# Patient Record
Sex: Female | Born: 1970 | Race: White | Hispanic: No | Marital: Single | Smoking: Current some day smoker
Health system: Southern US, Community
[De-identification: ages and names within clinical notes are randomized; demographics above are authoritative.]

## PROBLEM LIST (undated history)

## (undated) DIAGNOSIS — C20 Malignant neoplasm of rectum: Secondary | ICD-10-CM

---

## 2000-02-15 ENCOUNTER — Ambulatory Visit (HOSPITAL_COMMUNITY): Admission: RE | Admit: 2000-02-15 | Discharge: 2000-02-15 | Payer: Self-pay | Admitting: Family Medicine

## 2000-02-15 ENCOUNTER — Encounter: Payer: Self-pay | Admitting: Family Medicine

## 2001-01-12 ENCOUNTER — Other Ambulatory Visit: Admission: RE | Admit: 2001-01-12 | Discharge: 2001-01-12 | Payer: Self-pay | Admitting: Obstetrics and Gynecology

## 2006-03-21 ENCOUNTER — Ambulatory Visit: Payer: Self-pay | Admitting: Family Medicine

## 2007-09-07 ENCOUNTER — Telehealth (INDEPENDENT_AMBULATORY_CARE_PROVIDER_SITE_OTHER): Payer: Self-pay | Admitting: *Deleted

## 2009-06-16 ENCOUNTER — Telehealth (INDEPENDENT_AMBULATORY_CARE_PROVIDER_SITE_OTHER): Payer: Self-pay | Admitting: *Deleted

## 2009-06-16 ENCOUNTER — Ambulatory Visit: Payer: Self-pay | Admitting: Family Medicine

## 2009-06-16 DIAGNOSIS — G43909 Migraine, unspecified, not intractable, without status migrainosus: Secondary | ICD-10-CM | POA: Insufficient documentation

## 2009-06-23 ENCOUNTER — Telehealth: Payer: Self-pay | Admitting: Family Medicine

## 2009-07-10 ENCOUNTER — Encounter: Payer: Self-pay | Admitting: Family Medicine

## 2009-07-10 ENCOUNTER — Ambulatory Visit: Payer: Self-pay | Admitting: Family Medicine

## 2009-07-10 ENCOUNTER — Other Ambulatory Visit: Admission: RE | Admit: 2009-07-10 | Discharge: 2009-07-10 | Payer: Self-pay | Admitting: Family Medicine

## 2009-07-10 DIAGNOSIS — K589 Irritable bowel syndrome without diarrhea: Secondary | ICD-10-CM | POA: Insufficient documentation

## 2009-07-10 DIAGNOSIS — R5383 Other fatigue: Secondary | ICD-10-CM

## 2009-07-10 DIAGNOSIS — N949 Unspecified condition associated with female genital organs and menstrual cycle: Secondary | ICD-10-CM

## 2009-07-10 DIAGNOSIS — R5381 Other malaise: Secondary | ICD-10-CM | POA: Insufficient documentation

## 2009-07-10 DIAGNOSIS — N938 Other specified abnormal uterine and vaginal bleeding: Secondary | ICD-10-CM | POA: Insufficient documentation

## 2009-07-10 LAB — CONVERTED CEMR LAB
ALT: 24 units/L (ref 0–35)
Alkaline Phosphatase: 50 units/L (ref 39–117)
Basophils Relative: 0.3 % (ref 0.0–3.0)
Bilirubin, Direct: 0 mg/dL (ref 0.0–0.3)
Calcium: 9.3 mg/dL (ref 8.4–10.5)
Creatinine, Ser: 0.7 mg/dL (ref 0.4–1.2)
Eosinophils Absolute: 0.2 10*3/uL (ref 0.0–0.7)
Eosinophils Relative: 6.3 % — ABNORMAL HIGH (ref 0.0–5.0)
FSH: 4.8 milliintl units/mL
Free T4: 0.9 ng/dL (ref 0.6–1.6)
GFR calc non Af Amer: 100.22 mL/min (ref >60)
Glucose, Urine, Semiquant: NEGATIVE
Lymphocytes Relative: 31.1 % (ref 12.0–46.0)
Neutrophils Relative %: 48.1 % (ref 43.0–77.0)
RBC: 3.93 M/uL (ref 3.87–5.11)
Sed Rate: 12 mm/hr (ref 0–22)
Specific Gravity, Urine: 1.01
T3, Free: 2.8 pg/mL (ref 2.3–4.2)
Total Protein: 6.7 g/dL (ref 6.0–8.3)
WBC Urine, dipstick: NEGATIVE
WBC: 3.2 10*3/uL — ABNORMAL LOW (ref 4.5–10.5)
pH: 7

## 2009-07-15 ENCOUNTER — Ambulatory Visit: Payer: Self-pay | Admitting: Family Medicine

## 2009-10-29 ENCOUNTER — Telehealth: Payer: Self-pay | Admitting: Family Medicine

## 2009-12-08 ENCOUNTER — Encounter: Payer: Self-pay | Admitting: Gastroenterology

## 2009-12-08 ENCOUNTER — Ambulatory Visit: Payer: Self-pay | Admitting: Hematology and Oncology

## 2009-12-10 ENCOUNTER — Encounter: Payer: Self-pay | Admitting: Gastroenterology

## 2009-12-10 LAB — COMPREHENSIVE METABOLIC PANEL
ALT: 14 U/L (ref 0–35)
AST: 15 U/L (ref 0–37)
Alkaline Phosphatase: 61 U/L (ref 39–117)
CO2: 24 mEq/L (ref 19–32)
Sodium: 139 mEq/L (ref 135–145)
Total Bilirubin: 0.7 mg/dL (ref 0.3–1.2)
Total Protein: 6.8 g/dL (ref 6.0–8.3)

## 2009-12-10 LAB — CBC WITH DIFFERENTIAL/PLATELET
BASO%: 0.7 % (ref 0.0–2.0)
EOS%: 2.6 % (ref 0.0–7.0)
Eosinophils Absolute: 0.2 10*3/uL (ref 0.0–0.5)
MCHC: 34.8 g/dL (ref 31.5–36.0)
MCV: 90.4 fL (ref 79.5–101.0)
MONO%: 7.9 % (ref 0.0–14.0)
NEUT#: 4.7 10*3/uL (ref 1.5–6.5)
RBC: 4.2 10*6/uL (ref 3.70–5.45)
RDW: 12.4 % (ref 11.2–14.5)

## 2009-12-10 LAB — CEA: CEA: 6.2 ng/mL — ABNORMAL HIGH (ref 0.0–5.0)

## 2009-12-11 ENCOUNTER — Encounter: Payer: Self-pay | Admitting: Gastroenterology

## 2009-12-11 ENCOUNTER — Telehealth: Payer: Self-pay | Admitting: Gastroenterology

## 2009-12-12 ENCOUNTER — Ambulatory Visit (HOSPITAL_COMMUNITY): Admission: RE | Admit: 2009-12-12 | Discharge: 2009-12-12 | Payer: Self-pay | Admitting: Hematology and Oncology

## 2009-12-12 ENCOUNTER — Telehealth (INDEPENDENT_AMBULATORY_CARE_PROVIDER_SITE_OTHER): Payer: Self-pay | Admitting: *Deleted

## 2009-12-12 ENCOUNTER — Encounter: Payer: Self-pay | Admitting: Gastroenterology

## 2009-12-12 DIAGNOSIS — C20 Malignant neoplasm of rectum: Secondary | ICD-10-CM | POA: Insufficient documentation

## 2009-12-15 ENCOUNTER — Ambulatory Visit: Payer: Self-pay | Admitting: Gastroenterology

## 2009-12-15 ENCOUNTER — Ambulatory Visit: Admission: RE | Admit: 2009-12-15 | Discharge: 2010-02-10 | Payer: Self-pay | Admitting: Radiation Oncology

## 2009-12-15 ENCOUNTER — Ambulatory Visit (HOSPITAL_COMMUNITY): Admission: RE | Admit: 2009-12-15 | Discharge: 2009-12-15 | Payer: Self-pay | Admitting: Gastroenterology

## 2009-12-17 ENCOUNTER — Encounter: Admission: RE | Admit: 2009-12-17 | Discharge: 2009-12-17 | Payer: Self-pay | Admitting: Hematology and Oncology

## 2009-12-30 ENCOUNTER — Ambulatory Visit (HOSPITAL_COMMUNITY): Admission: RE | Admit: 2009-12-30 | Discharge: 2009-12-30 | Payer: Self-pay | Admitting: Hematology and Oncology

## 2010-01-01 LAB — CBC WITH DIFFERENTIAL/PLATELET
Basophils Absolute: 0.1 10*3/uL (ref 0.0–0.1)
EOS%: 4.6 % (ref 0.0–7.0)
HGB: 13.2 g/dL (ref 11.6–15.9)
LYMPH%: 19.8 % (ref 14.0–49.7)
MCH: 30.8 pg (ref 25.1–34.0)
MCV: 90.4 fL (ref 79.5–101.0)
MONO%: 9.7 % (ref 0.0–14.0)
Platelets: 187 10*3/uL (ref 145–400)
RBC: 4.28 10*6/uL (ref 3.70–5.45)
RDW: 13 % (ref 11.2–14.5)

## 2010-01-01 LAB — BASIC METABOLIC PANEL
BUN: 9 mg/dL (ref 6–23)
CO2: 25 mEq/L (ref 19–32)
Chloride: 105 mEq/L (ref 96–112)
Creatinine, Ser: 0.66 mg/dL (ref 0.40–1.20)
Potassium: 4.2 mEq/L (ref 3.5–5.3)

## 2010-01-08 ENCOUNTER — Ambulatory Visit: Payer: Self-pay | Admitting: Hematology and Oncology

## 2010-01-08 LAB — CBC WITH DIFFERENTIAL/PLATELET
BASO%: 0.3 % (ref 0.0–2.0)
EOS%: 5.6 % (ref 0.0–7.0)
Eosinophils Absolute: 0.2 10*3/uL (ref 0.0–0.5)
LYMPH%: 19 % (ref 14.0–49.7)
MCH: 31.8 pg (ref 25.1–34.0)
MCHC: 35 g/dL (ref 31.5–36.0)
MCV: 91.1 fL (ref 79.5–101.0)
MONO%: 9.5 % (ref 0.0–14.0)
NEUT#: 2.2 10*3/uL (ref 1.5–6.5)
Platelets: 237 10*3/uL (ref 145–400)
RBC: 4.23 10*6/uL (ref 3.70–5.45)
RDW: 12.5 % (ref 11.2–14.5)

## 2010-01-15 LAB — COMPREHENSIVE METABOLIC PANEL
ALT: 14 U/L (ref 0–35)
Albumin: 4.4 g/dL (ref 3.5–5.2)
CO2: 19 mEq/L (ref 19–32)
Potassium: 4.1 mEq/L (ref 3.5–5.3)
Sodium: 138 mEq/L (ref 135–145)
Total Bilirubin: 0.5 mg/dL (ref 0.3–1.2)
Total Protein: 6.5 g/dL (ref 6.0–8.3)

## 2010-01-15 LAB — CBC WITH DIFFERENTIAL/PLATELET
BASO%: 0.3 % (ref 0.0–2.0)
EOS%: 8.3 % — ABNORMAL HIGH (ref 0.0–7.0)
HCT: 38.3 % (ref 34.8–46.6)
LYMPH%: 13.7 % — ABNORMAL LOW (ref 14.0–49.7)
MCH: 31 pg (ref 25.1–34.0)
MCHC: 33.9 g/dL (ref 31.5–36.0)
NEUT%: 65.3 % (ref 38.4–76.8)
Platelets: 125 10*3/uL — ABNORMAL LOW (ref 145–400)
RBC: 4.2 10*6/uL (ref 3.70–5.45)
lymph#: 0.4 10*3/uL — ABNORMAL LOW (ref 0.9–3.3)

## 2010-01-15 LAB — VITAMIN D 25 HYDROXY (VIT D DEFICIENCY, FRACTURES): Vit D, 25-Hydroxy: 26 ng/mL — ABNORMAL LOW (ref 30–89)

## 2010-01-22 LAB — CBC WITH DIFFERENTIAL/PLATELET
BASO%: 0.2 % (ref 0.0–2.0)
EOS%: 13.8 % — ABNORMAL HIGH (ref 0.0–7.0)
MCH: 32.3 pg (ref 25.1–34.0)
MCV: 92.5 fL (ref 79.5–101.0)
MONO%: 12.6 % (ref 0.0–14.0)
RBC: 4.07 10*6/uL (ref 3.70–5.45)
RDW: 12.6 % (ref 11.2–14.5)

## 2010-01-22 LAB — BASIC METABOLIC PANEL
Potassium: 4.1 mEq/L (ref 3.5–5.3)
Sodium: 141 mEq/L (ref 135–145)

## 2010-01-29 LAB — CBC WITH DIFFERENTIAL/PLATELET
BASO%: 0.3 % (ref 0.0–2.0)
EOS%: 11.4 % — ABNORMAL HIGH (ref 0.0–7.0)
HCT: 35.8 % (ref 34.8–46.6)
MCH: 31.7 pg (ref 25.1–34.0)
MCHC: 35.2 g/dL (ref 31.5–36.0)
NEUT%: 68.9 % (ref 38.4–76.8)
lymph#: 0.2 10*3/uL — ABNORMAL LOW (ref 0.9–3.3)

## 2010-01-29 LAB — BASIC METABOLIC PANEL
CO2: 25 mEq/L (ref 19–32)
Glucose, Bld: 95 mg/dL (ref 70–99)
Potassium: 3.9 mEq/L (ref 3.5–5.3)
Sodium: 139 mEq/L (ref 135–145)

## 2010-02-05 ENCOUNTER — Ambulatory Visit: Payer: Self-pay | Admitting: Hematology and Oncology

## 2010-02-05 LAB — CBC WITH DIFFERENTIAL/PLATELET
BASO%: 1.8 % (ref 0.0–2.0)
Basophils Absolute: 0.1 10*3/uL (ref 0.0–0.1)
EOS%: 9.6 % — ABNORMAL HIGH (ref 0.0–7.0)
HGB: 12.7 g/dL (ref 11.6–15.9)
MCH: 32.2 pg (ref 25.1–34.0)
MCHC: 35.2 g/dL (ref 31.5–36.0)
RDW: 16.9 % — ABNORMAL HIGH (ref 11.2–14.5)
lymph#: 0.5 10*3/uL — ABNORMAL LOW (ref 0.9–3.3)
nRBC: 0 % (ref 0–0)

## 2010-02-10 LAB — CBC WITH DIFFERENTIAL/PLATELET
Eosinophils Absolute: 0.3 10*3/uL (ref 0.0–0.5)
MONO#: 0.4 10*3/uL (ref 0.1–0.9)
NEUT#: 2.3 10*3/uL (ref 1.5–6.5)
RBC: 3.73 10*6/uL (ref 3.70–5.45)
RDW: 20 % — ABNORMAL HIGH (ref 11.2–14.5)
WBC: 3.2 10*3/uL — ABNORMAL LOW (ref 3.9–10.3)
lymph#: 0.2 10*3/uL — ABNORMAL LOW (ref 0.9–3.3)

## 2010-02-10 LAB — BASIC METABOLIC PANEL
CO2: 23 mEq/L (ref 19–32)
Glucose, Bld: 121 mg/dL — ABNORMAL HIGH (ref 70–99)
Potassium: 3.8 mEq/L (ref 3.5–5.3)
Sodium: 140 mEq/L (ref 135–145)

## 2010-03-06 ENCOUNTER — Ambulatory Visit (HOSPITAL_COMMUNITY): Admission: RE | Admit: 2010-03-06 | Discharge: 2010-03-06 | Payer: Self-pay | Admitting: Hematology and Oncology

## 2010-03-06 LAB — COMPREHENSIVE METABOLIC PANEL
Alkaline Phosphatase: 44 U/L (ref 39–117)
BUN: 13 mg/dL (ref 6–23)
CO2: 27 mEq/L (ref 19–32)
Creatinine, Ser: 0.75 mg/dL (ref 0.40–1.20)
Glucose, Bld: 83 mg/dL (ref 70–99)
Sodium: 140 mEq/L (ref 135–145)
Total Bilirubin: 0.7 mg/dL (ref 0.3–1.2)
Total Protein: 6.6 g/dL (ref 6.0–8.3)

## 2010-03-06 LAB — CBC WITH DIFFERENTIAL/PLATELET
Basophils Absolute: 0 10*3/uL (ref 0.0–0.1)
Eosinophils Absolute: 0.3 10*3/uL (ref 0.0–0.5)
HCT: 36.5 % (ref 34.8–46.6)
LYMPH%: 9.6 % — ABNORMAL LOW (ref 14.0–49.7)
MCV: 97.3 fL (ref 79.5–101.0)
MONO#: 0.3 10*3/uL (ref 0.1–0.9)
MONO%: 9.4 % (ref 0.0–14.0)
NEUT%: 70.3 % (ref 38.4–76.8)
Platelets: 214 10*3/uL (ref 145–400)

## 2010-03-06 LAB — CEA: CEA: 1.4 ng/mL (ref 0.0–5.0)

## 2010-03-09 ENCOUNTER — Ambulatory Visit: Payer: Self-pay | Admitting: Hematology and Oncology

## 2010-03-15 HISTORY — PX: RECTAL SURGERY: SHX760

## 2010-04-27 ENCOUNTER — Ambulatory Visit: Payer: Self-pay | Admitting: Hematology and Oncology

## 2010-04-29 LAB — COMPREHENSIVE METABOLIC PANEL
ALT: 9 U/L (ref 0–35)
AST: 14 U/L (ref 0–37)
Albumin: 4.1 g/dL (ref 3.5–5.2)
BUN: 13 mg/dL (ref 6–23)
Calcium: 9.3 mg/dL (ref 8.4–10.5)
Chloride: 106 mEq/L (ref 96–112)
Potassium: 4.1 mEq/L (ref 3.5–5.3)

## 2010-04-29 LAB — CBC WITH DIFFERENTIAL/PLATELET
Basophils Absolute: 0 10*3/uL (ref 0.0–0.1)
EOS%: 7.2 % — ABNORMAL HIGH (ref 0.0–7.0)
HGB: 12 g/dL (ref 11.6–15.9)
MCH: 32.9 pg (ref 25.1–34.0)
NEUT#: 2.7 10*3/uL (ref 1.5–6.5)
RDW: 12 % (ref 11.2–14.5)
lymph#: 0.4 10*3/uL — ABNORMAL LOW (ref 0.9–3.3)

## 2010-05-27 ENCOUNTER — Ambulatory Visit: Payer: Self-pay | Admitting: Hematology and Oncology

## 2010-05-27 LAB — CBC WITH DIFFERENTIAL/PLATELET
BASO%: 0.5 % (ref 0.0–2.0)
EOS%: 5.2 % (ref 0.0–7.0)
MCH: 30.8 pg (ref 25.1–34.0)
MCHC: 34.3 g/dL (ref 31.5–36.0)
NEUT%: 73.5 % (ref 38.4–76.8)
RBC: 4.22 10*6/uL (ref 3.70–5.45)
RDW: 12.5 % (ref 11.2–14.5)
lymph#: 0.5 10*3/uL — ABNORMAL LOW (ref 0.9–3.3)

## 2010-07-03 ENCOUNTER — Ambulatory Visit: Payer: Self-pay | Admitting: Hematology and Oncology

## 2010-07-07 LAB — COMPREHENSIVE METABOLIC PANEL
Albumin: 5.1 g/dL (ref 3.5–5.2)
CO2: 24 mEq/L (ref 19–32)
Calcium: 9.6 mg/dL (ref 8.4–10.5)
Glucose, Bld: 87 mg/dL (ref 70–99)
Potassium: 4 mEq/L (ref 3.5–5.3)
Sodium: 140 mEq/L (ref 135–145)
Total Protein: 6.8 g/dL (ref 6.0–8.3)

## 2010-07-07 LAB — CBC WITH DIFFERENTIAL/PLATELET
Basophils Absolute: 0 10*3/uL (ref 0.0–0.1)
Eosinophils Absolute: 0.2 10*3/uL (ref 0.0–0.5)
HGB: 13.3 g/dL (ref 11.6–15.9)
MONO#: 0.4 10*3/uL (ref 0.1–0.9)
NEUT#: 2.3 10*3/uL (ref 1.5–6.5)
Platelets: 147 10*3/uL (ref 145–400)
RBC: 3.99 10*6/uL (ref 3.70–5.45)
RDW: 20.1 % — ABNORMAL HIGH (ref 11.2–14.5)
WBC: 3.2 10*3/uL — ABNORMAL LOW (ref 3.9–10.3)

## 2010-07-07 LAB — LACTATE DEHYDROGENASE: LDH: 182 U/L (ref 94–250)

## 2010-07-09 LAB — VITAMIN B12: Vitamin B-12: 371 pg/mL (ref 211–911)

## 2010-07-28 LAB — CBC WITH DIFFERENTIAL/PLATELET
Basophils Absolute: 0 10*3/uL (ref 0.0–0.1)
Eosinophils Absolute: 0.2 10*3/uL (ref 0.0–0.5)
HCT: 35.6 % (ref 34.8–46.6)
HGB: 12.4 g/dL (ref 11.6–15.9)
LYMPH%: 10 % — ABNORMAL LOW (ref 14.0–49.7)
MCV: 96.2 fL (ref 79.5–101.0)
MONO#: 0.4 10*3/uL (ref 0.1–0.9)
MONO%: 11.8 % (ref 0.0–14.0)
NEUT#: 2.8 10*3/uL (ref 1.5–6.5)
NEUT%: 73.5 % (ref 38.4–76.8)
Platelets: 163 10*3/uL (ref 145–400)
RBC: 3.7 10*6/uL (ref 3.70–5.45)
WBC: 3.8 10*3/uL — ABNORMAL LOW (ref 3.9–10.3)

## 2010-07-28 LAB — COMPREHENSIVE METABOLIC PANEL
BUN: 17 mg/dL (ref 6–23)
CO2: 23 mEq/L (ref 19–32)
Glucose, Bld: 100 mg/dL — ABNORMAL HIGH (ref 70–99)
Sodium: 142 mEq/L (ref 135–145)
Total Bilirubin: 1.4 mg/dL — ABNORMAL HIGH (ref 0.3–1.2)
Total Protein: 6.6 g/dL (ref 6.0–8.3)

## 2010-08-14 ENCOUNTER — Ambulatory Visit: Payer: Self-pay | Admitting: Hematology and Oncology

## 2010-08-18 LAB — COMPREHENSIVE METABOLIC PANEL
ALT: 16 U/L (ref 0–35)
Alkaline Phosphatase: 65 U/L (ref 39–117)
Creatinine, Ser: 0.88 mg/dL (ref 0.40–1.20)
Sodium: 143 mEq/L (ref 135–145)
Total Bilirubin: 1.4 mg/dL — ABNORMAL HIGH (ref 0.3–1.2)
Total Protein: 6.4 g/dL (ref 6.0–8.3)

## 2010-08-18 LAB — CBC WITH DIFFERENTIAL/PLATELET
BASO%: 0.7 % (ref 0.0–2.0)
HCT: 34.9 % (ref 34.8–46.6)
LYMPH%: 19.5 % (ref 14.0–49.7)
MCH: 35 pg — ABNORMAL HIGH (ref 25.1–34.0)
MCHC: 35.6 g/dL (ref 31.5–36.0)
MCV: 98.4 fL (ref 79.5–101.0)
MONO%: 16.2 % — ABNORMAL HIGH (ref 0.0–14.0)
NEUT%: 54.2 % (ref 38.4–76.8)
Platelets: 206 10*3/uL (ref 145–400)
RBC: 3.55 10*6/uL — ABNORMAL LOW (ref 3.70–5.45)

## 2010-09-03 LAB — COMPREHENSIVE METABOLIC PANEL
Alkaline Phosphatase: 66 U/L (ref 39–117)
BUN: 18 mg/dL (ref 6–23)
CO2: 23 mEq/L (ref 19–32)
Creatinine, Ser: 0.69 mg/dL (ref 0.40–1.20)
Glucose, Bld: 88 mg/dL (ref 70–99)
Sodium: 140 mEq/L (ref 135–145)
Total Bilirubin: 2.4 mg/dL — ABNORMAL HIGH (ref 0.3–1.2)

## 2010-09-03 LAB — CBC WITH DIFFERENTIAL/PLATELET
Eosinophils Absolute: 0.2 10*3/uL (ref 0.0–0.5)
HCT: 35.1 % (ref 34.8–46.6)
LYMPH%: 12.2 % — ABNORMAL LOW (ref 14.0–49.7)
MCV: 101 fL (ref 79.5–101.0)
MONO#: 0.3 10*3/uL (ref 0.1–0.9)
MONO%: 10.2 % (ref 0.0–14.0)
NEUT#: 1.7 10*3/uL (ref 1.5–6.5)
NEUT%: 67.9 % (ref 38.4–76.8)
Platelets: 178 10*3/uL (ref 145–400)
RBC: 3.48 10*6/uL — ABNORMAL LOW (ref 3.70–5.45)
WBC: 2.5 10*3/uL — ABNORMAL LOW (ref 3.9–10.3)

## 2010-09-30 ENCOUNTER — Ambulatory Visit: Payer: Self-pay | Admitting: Hematology and Oncology

## 2010-10-02 ENCOUNTER — Ambulatory Visit (HOSPITAL_COMMUNITY): Admission: RE | Admit: 2010-10-02 | Discharge: 2010-10-02 | Payer: Self-pay | Admitting: Hematology and Oncology

## 2010-10-02 LAB — CBC WITH DIFFERENTIAL/PLATELET
BASO%: 0.9 % (ref 0.0–2.0)
Eosinophils Absolute: 0.2 10*3/uL (ref 0.0–0.5)
LYMPH%: 18.1 % (ref 14.0–49.7)
MCHC: 35.8 g/dL (ref 31.5–36.0)
MONO#: 0.4 10*3/uL (ref 0.1–0.9)
NEUT#: 1.6 10*3/uL (ref 1.5–6.5)
RBC: 3.35 10*6/uL — ABNORMAL LOW (ref 3.70–5.45)
RDW: 18.1 % — ABNORMAL HIGH (ref 11.2–14.5)
WBC: 2.7 10*3/uL — ABNORMAL LOW (ref 3.9–10.3)
lymph#: 0.5 10*3/uL — ABNORMAL LOW (ref 0.9–3.3)

## 2010-10-02 LAB — COMPREHENSIVE METABOLIC PANEL
ALT: 15 U/L (ref 0–35)
Albumin: 5.3 g/dL — ABNORMAL HIGH (ref 3.5–5.2)
CO2: 25 mEq/L (ref 19–32)
Glucose, Bld: 58 mg/dL — ABNORMAL LOW (ref 70–99)
Potassium: 3.4 mEq/L — ABNORMAL LOW (ref 3.5–5.3)
Sodium: 136 mEq/L (ref 135–145)
Total Bilirubin: 2.2 mg/dL — ABNORMAL HIGH (ref 0.3–1.2)
Total Protein: 7.3 g/dL (ref 6.0–8.3)

## 2010-10-02 LAB — CEA: CEA: 1.4 ng/mL (ref 0.0–5.0)

## 2010-10-06 ENCOUNTER — Encounter: Payer: Self-pay | Admitting: Family Medicine

## 2010-10-15 HISTORY — PX: OTHER SURGICAL HISTORY: SHX169

## 2010-10-26 ENCOUNTER — Ambulatory Visit: Payer: Self-pay | Admitting: Genetic Counselor

## 2010-11-27 ENCOUNTER — Ambulatory Visit: Payer: Self-pay | Admitting: Hematology and Oncology

## 2010-12-04 ENCOUNTER — Other Ambulatory Visit: Payer: Self-pay | Admitting: Hematology and Oncology

## 2010-12-04 DIAGNOSIS — C20 Malignant neoplasm of rectum: Secondary | ICD-10-CM

## 2010-12-08 ENCOUNTER — Ambulatory Visit: Payer: Self-pay | Admitting: Genetic Counselor

## 2010-12-15 NOTE — Progress Notes (Signed)
Summary: Triage  Phone Note From Other Clinic   Caller: Tanya  Dr. Dalene Carrow  [phone] Call For: Dr. Christella Hartigan Summary of Call: Dr. Dalene Carrow spoke with Dr. Christella Hartigan about scheduling this patient a rectal ultrasound and follow up on this. Initial call taken by: Karna Christmas,  December 11, 2009 1:23 PM  Follow-up for Phone Call        records on Dr Christella Hartigan desk.  Will schedule when I recieve. Follow-up by: Chales Abrahams CMA Duncan Dull),  December 11, 2009 2:26 PM

## 2010-12-15 NOTE — Progress Notes (Signed)
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

## 2010-12-15 NOTE — Procedures (Signed)
Summary: Endoscopic Ultrasound  Patient: Denise Michael Note: All result statuses are Final unless otherwise noted.  Tests: (1) Endoscopic Ultrasound (EUS)  EUS Endoscopic Ultrasound                             DONE     [facility]     [address] [place], [place]  [address]           ENDOSCOPIC ULTRASOUND PROCEDURE REPORT           PATIENT:  [name], [name]  MR#:  [id number]     BIRTHDATE:  June 24, 1971  GENDER:  female     NDOSCOPIST:  Rachael Fee, MD     REFERRED BY:  Arlan Organ, M.D.     PROCEDURE DATE  12/15/2009     PROCEDURE:  Lower EUS     ASA CLASS:  Class II     INDICATIONS:  newly diagnosed rectal adenocarcinoma (Incomplete     colonoscopy by Dr. Kinnie Scales recently).  PET/CT showed no sign of     metastatic disease.     MEDICATIONS:   Fentanyl 75 mcg IV, Versed 7.5 mg IV           DESCRIPTION OF PROCEDURE:   After the risks, benefits, and     alternatives of the procedure were thoroughly explained, informed     consent was obtained.  The  endoscope was introduced through the     and advanced to the .     <<PROCEDUREIMAGES>>           Sigmoidoscopic findings:     1. Nearly circumferential, bulkly, fungating mass in rectum. The     mass occupies 90%of the rectal lumen circumference, the distal     edge of the mass is 8cm from anal verge.           EUS findings:     1. The mass above corresponds with an irregularly bordered,     heterogeneous, predominantly hypoechoic mass that clear passes     into and through the muscularis propria layer of rectal wall     (uT3).  The mass measures 2.2cm in thickness and 7cm in length.     2. There are two 4-75mm, round, well demarcated, hypoechoic     perirectal lymphnodes that are suspicious for malignant     involvement (uN1).           Impression:     7cm long, nearly circumferntial, partially obstructing, uT3N1     rectal adenocarcinoma with distal edge 8cm from anal verge.  I     have  spoken with office of Dr. Dalene Carrow, she will likely benefit     from neoadjuvant chemoxrt.     I will leave future, surveillance colonoscopies to Dr. Kinnie Scales who     diagnosed this cancer last week.           ______________________________     Rachael Fee, MD           cc: Ritta Slot, MD           n.     Rosalie Doctor:   Rachael Fee at 12/15/2009 12:41 PM           Ostlund, Victorino Dike, [phone]  Note: An exclamation mark (!) indicates a result that was not dispersed into the flowsheet. Document Creation Date: 12/15/2009 12:42 PM _______________________________________________________________________  (1)  Order result status: Final Collection or observation date-time: 12/15/2009 12:30 Requested date-time:  Receipt date-time:  Reported date-time:  Referring Physician:   Ordering Physician: Rob Bunting [phone]) Specimen Source:  Source: Launa Grill Order Number: [id number] Lab site:

## 2010-12-15 NOTE — Letter (Signed)
Summary: [facility]  [address] [place], [place] [address]   Phone: [phone]  Fax: [phone]       Denise Michael    [birth year]    MRN: [id number]        Procedure Day /Date:12/15/09     Arrival Time:11 am     Procedure Time:noon     Location of Procedure:                     X  [facility] ( Outpatient Registration)     PREPARATION FOR FLEXIBLE SIGMOIDOSCOPY WITH MAGNESIUM CITRATE  Prior to the day before your procedure, purchase one 8 oz. bottle of Magnesium Citrate and one Fleet Enema from the laxative section of your drugstore.  _________________________________________________________________________________________________  THE DAY BEFORE YOUR PROCEDURE             DATE: 12/14/09    DAY: SUN  1.   Have a clear liquid dinner the night before your procedure.  2.   Do not drink anything colored red or purple.  Avoid juices with pulp.  No orange juice.              CLEAR LIQUIDS INCLUDE: Water Jello Ice Popsicles Tea (sugar ok, no milk/cream) Powdered fruit flavored drinks Coffee (sugar ok, no milk/cream) Gatorade Juice: apple, white grape, white cranberry  Lemonade Clear bullion, consomm, broth Carbonated beverages (any kind) Strained chicken noodle soup Hard Candy   3.   At 7:00 pm the night before your procedure, drink one bottle of Magnesium Citrate over ice.  4.   Drink at least 3 more glasses of clear liquids before bedtime (preferably juices).  5.   Results are expected usually within 1 to 6 hours after taking the Magnesium Citrate.  ___________________________________________________________________________________________________  THE DAY OF YOUR PROCEDURE            DATE: 12/15/09     DAY: MON  1.   Use Fleet Enema one hour prior to coming for procedure.  2.   You may drink clear liquids until 8 am (2 hours before exam)       MEDICATION INSTRUCTIONS  Unless otherwise instructed,  you should take regular prescription medications with a small sip of water as early as possible the morning of your procedure.         OTHER INSTRUCTIONS  You will need a responsible adult at least 39 years of age to accompany you and drive you home.   This person must remain in the waiting room during your procedure.  Wear loose fitting clothing that is easily removed.  Leave jewelry and other valuables at home.  However, you may wish to bring a book to read or an iPod/MP3 player to listen to music as you wait for your procedure to start.  Remove all body piercing jewelry and leave at home.  Total time from sign-in until discharge is approximately 2-3 hours.  You should go home directly after your procedure and rest.  You can resume normal activities the day after your procedure.  The day of your procedure you should not:   Drive   Make legal decisions   Operate machinery   Drink alcohol   Return to work  You will receive specific instructions about eating, activities and medications before you leave.   The above instructions have been reviewed and explained to me by   Chales Abrahams CMA Duncan Dull)  December 12, 2009 4:44  PM      I fully understand and can verbalize these instructions over the phone Date 12/12/09

## 2010-12-15 NOTE — Letter (Signed)
Summary: [facility]  Medication list/[facility]   Imported By: Sherian Rein 12/18/2009 09:17:53  _____________________________________________________________________  External Attachment:    Type:   Image     Comment:   External Document

## 2010-12-15 NOTE — Procedures (Signed)
Summary: Flexible Sigmoidoscopy/[facility]  Flexible Sigmoidoscopy/[facility]   Imported By: Sherian Rein 12/18/2009 09:13:26  _____________________________________________________________________  External Attachment:    Type:   Image     Comment:   External Document

## 2010-12-15 NOTE — Letter (Signed)
Summary: [facility]  [facility]   Imported By: Maryln Gottron 10/14/2010 10:04:48  _____________________________________________________________________  External Attachment:    Type:   Image     Comment:   External Document  Appended Document: [facility].......... please call Karn and have her come in sometime this winter for a general medical exam  Appended Document: [facility] patient is aware and will call back for an appointment

## 2010-12-25 ENCOUNTER — Other Ambulatory Visit: Payer: Self-pay | Admitting: Hematology and Oncology

## 2010-12-25 DIAGNOSIS — Z1231 Encounter for screening mammogram for malignant neoplasm of breast: Secondary | ICD-10-CM

## 2011-01-05 ENCOUNTER — Other Ambulatory Visit (HOSPITAL_COMMUNITY): Payer: Self-pay

## 2011-01-05 ENCOUNTER — Ambulatory Visit (HOSPITAL_COMMUNITY)
Admission: RE | Admit: 2011-01-05 | Discharge: 2011-01-05 | Disposition: A | Discharge status: Home / Self Care | Payer: BC Managed Care – PPO | Source: Ambulatory Visit | Attending: Hematology and Oncology | Admitting: Hematology and Oncology

## 2011-01-05 ENCOUNTER — Other Ambulatory Visit: Payer: Self-pay | Admitting: Hematology and Oncology

## 2011-01-05 DIAGNOSIS — K7689 Other specified diseases of liver: Secondary | ICD-10-CM | POA: Insufficient documentation

## 2011-01-05 DIAGNOSIS — C19 Malignant neoplasm of rectosigmoid junction: Secondary | ICD-10-CM | POA: Insufficient documentation

## 2011-01-05 DIAGNOSIS — C20 Malignant neoplasm of rectum: Secondary | ICD-10-CM

## 2011-01-05 DIAGNOSIS — Z09 Encounter for follow-up examination after completed treatment for conditions other than malignant neoplasm: Secondary | ICD-10-CM | POA: Insufficient documentation

## 2011-01-05 MED ORDER — IOHEXOL 300 MG/ML  SOLN
100.0000 mL | Freq: Once | INTRAMUSCULAR | Status: AC | PRN
  Administered 2011-01-05: 100 mL via INTRAVENOUS

## 2011-01-08 ENCOUNTER — Ambulatory Visit
Admission: RE | Admit: 2011-01-08 | Discharge: 2011-01-08 | Disposition: A | Discharge status: Home / Self Care | Payer: BC Managed Care – PPO | Source: Ambulatory Visit | Attending: Hematology and Oncology | Admitting: Hematology and Oncology

## 2011-01-08 DIAGNOSIS — Z1231 Encounter for screening mammogram for malignant neoplasm of breast: Secondary | ICD-10-CM

## 2011-01-12 ENCOUNTER — Other Ambulatory Visit: Payer: Self-pay | Admitting: Hematology and Oncology

## 2011-01-12 ENCOUNTER — Encounter (HOSPITAL_BASED_OUTPATIENT_CLINIC_OR_DEPARTMENT_OTHER): Payer: BC Managed Care – PPO | Admitting: Hematology and Oncology

## 2011-01-12 DIAGNOSIS — Z452 Encounter for adjustment and management of vascular access device: Secondary | ICD-10-CM

## 2011-01-12 DIAGNOSIS — R928 Other abnormal and inconclusive findings on diagnostic imaging of breast: Secondary | ICD-10-CM

## 2011-01-12 DIAGNOSIS — C20 Malignant neoplasm of rectum: Secondary | ICD-10-CM

## 2011-01-12 DIAGNOSIS — N6320 Unspecified lump in the left breast, unspecified quadrant: Secondary | ICD-10-CM

## 2011-01-12 LAB — COMPREHENSIVE METABOLIC PANEL
Albumin: 4.4 g/dL (ref 3.5–5.2)
Alkaline Phosphatase: 56 U/L (ref 39–117)
Calcium: 9.6 mg/dL (ref 8.4–10.5)
Chloride: 104 mEq/L (ref 96–112)
Glucose, Bld: 109 mg/dL — ABNORMAL HIGH (ref 70–99)
Potassium: 4.2 mEq/L (ref 3.5–5.3)
Sodium: 138 mEq/L (ref 135–145)
Total Protein: 6.4 g/dL (ref 6.0–8.3)

## 2011-01-12 LAB — CBC WITH DIFFERENTIAL/PLATELET
Eosinophils Absolute: 0.2 10*3/uL (ref 0.0–0.5)
HGB: 13 g/dL (ref 11.6–15.9)
MCV: 91.4 fL (ref 79.5–101.0)
MONO#: 0.4 10*3/uL (ref 0.1–0.9)
MONO%: 8.2 % (ref 0.0–14.0)
NEUT#: 3.8 10*3/uL (ref 1.5–6.5)
RBC: 4.12 10*6/uL (ref 3.70–5.45)
RDW: 12 % (ref 11.2–14.5)
WBC: 4.7 10*3/uL (ref 3.9–10.3)
lymph#: 0.3 10*3/uL — ABNORMAL LOW (ref 0.9–3.3)

## 2011-01-14 ENCOUNTER — Ambulatory Visit
Admission: RE | Admit: 2011-01-14 | Discharge: 2011-01-14 | Disposition: A | Discharge status: Home / Self Care | Payer: BC Managed Care – PPO | Source: Ambulatory Visit | Attending: Hematology and Oncology | Admitting: Hematology and Oncology

## 2011-01-14 DIAGNOSIS — R928 Other abnormal and inconclusive findings on diagnostic imaging of breast: Secondary | ICD-10-CM

## 2011-01-19 ENCOUNTER — Other Ambulatory Visit: Payer: BC Managed Care – PPO

## 2011-01-19 ENCOUNTER — Ambulatory Visit (INDEPENDENT_AMBULATORY_CARE_PROVIDER_SITE_OTHER): Payer: BC Managed Care – PPO | Admitting: Gynecology

## 2011-01-19 ENCOUNTER — Other Ambulatory Visit (HOSPITAL_COMMUNITY)
Admission: RE | Admit: 2011-01-19 | Discharge: 2011-01-19 | Disposition: A | Discharge status: Home / Self Care | Payer: BC Managed Care – PPO | Source: Ambulatory Visit | Attending: Gynecology | Admitting: Gynecology

## 2011-01-19 ENCOUNTER — Other Ambulatory Visit: Payer: Self-pay | Admitting: Gynecology

## 2011-01-19 DIAGNOSIS — N912 Amenorrhea, unspecified: Secondary | ICD-10-CM

## 2011-01-19 DIAGNOSIS — Z01419 Encounter for gynecological examination (general) (routine) without abnormal findings: Secondary | ICD-10-CM

## 2011-01-19 DIAGNOSIS — N83339 Acquired atrophy of ovary and fallopian tube, unspecified side: Secondary | ICD-10-CM

## 2011-01-19 DIAGNOSIS — Z124 Encounter for screening for malignant neoplasm of cervix: Secondary | ICD-10-CM | POA: Insufficient documentation

## 2011-01-20 ENCOUNTER — Other Ambulatory Visit (HOSPITAL_COMMUNITY): Payer: BC Managed Care – PPO

## 2011-01-26 ENCOUNTER — Institutional Professional Consult (permissible substitution) (INDEPENDENT_AMBULATORY_CARE_PROVIDER_SITE_OTHER): Payer: BC Managed Care – PPO | Admitting: Gynecology

## 2011-01-26 DIAGNOSIS — N951 Menopausal and female climacteric states: Secondary | ICD-10-CM

## 2011-01-26 DIAGNOSIS — N912 Amenorrhea, unspecified: Secondary | ICD-10-CM

## 2011-02-01 LAB — GLUCOSE, CAPILLARY: Glucose-Capillary: 98 mg/dL (ref 70–99)

## 2011-02-04 LAB — CBC
HCT: 38.3 % (ref 36.0–46.0)
MCV: 90.9 fL (ref 78.0–100.0)
Platelets: 196 10*3/uL (ref 150–400)
RDW: 13 % (ref 11.5–15.5)

## 2011-02-23 ENCOUNTER — Encounter: Payer: BC Managed Care – PPO | Admitting: Hematology and Oncology

## 2011-04-19 ENCOUNTER — Encounter (HOSPITAL_BASED_OUTPATIENT_CLINIC_OR_DEPARTMENT_OTHER): Payer: 59 | Admitting: Hematology and Oncology

## 2011-04-19 DIAGNOSIS — Z452 Encounter for adjustment and management of vascular access device: Secondary | ICD-10-CM

## 2011-04-19 DIAGNOSIS — C20 Malignant neoplasm of rectum: Secondary | ICD-10-CM

## 2011-05-05 ENCOUNTER — Other Ambulatory Visit: Payer: Self-pay | Admitting: Hematology and Oncology

## 2011-05-05 ENCOUNTER — Encounter (HOSPITAL_COMMUNITY): Payer: Self-pay

## 2011-05-05 ENCOUNTER — Ambulatory Visit (HOSPITAL_COMMUNITY)
Admission: RE | Admit: 2011-05-05 | Discharge: 2011-05-05 | Disposition: A | Discharge status: Home / Self Care | Payer: 59 | Source: Ambulatory Visit | Attending: Hematology and Oncology | Admitting: Hematology and Oncology

## 2011-05-05 ENCOUNTER — Encounter (HOSPITAL_BASED_OUTPATIENT_CLINIC_OR_DEPARTMENT_OTHER): Payer: 59 | Admitting: Hematology and Oncology

## 2011-05-05 DIAGNOSIS — Z5111 Encounter for antineoplastic chemotherapy: Secondary | ICD-10-CM

## 2011-05-05 DIAGNOSIS — K7689 Other specified diseases of liver: Secondary | ICD-10-CM | POA: Insufficient documentation

## 2011-05-05 DIAGNOSIS — C20 Malignant neoplasm of rectum: Secondary | ICD-10-CM | POA: Insufficient documentation

## 2011-05-05 HISTORY — DX: Malignant neoplasm of rectum: C20

## 2011-05-05 LAB — CBC WITH DIFFERENTIAL/PLATELET
BASO%: 0.8 % (ref 0.0–2.0)
EOS%: 2.9 % (ref 0.0–7.0)
MCH: 32.2 pg (ref 25.1–34.0)
MCHC: 35 g/dL (ref 31.5–36.0)
RDW: 12 % (ref 11.2–14.5)
lymph#: 0.6 10*3/uL — ABNORMAL LOW (ref 0.9–3.3)

## 2011-05-05 LAB — CEA: CEA: 0.8 ng/mL (ref 0.0–5.0)

## 2011-05-05 MED ORDER — IOHEXOL 300 MG/ML  SOLN
100.0000 mL | Freq: Once | INTRAMUSCULAR | Status: AC | PRN
  Administered 2011-05-05: 100 mL via INTRAVENOUS

## 2011-05-12 ENCOUNTER — Encounter (HOSPITAL_BASED_OUTPATIENT_CLINIC_OR_DEPARTMENT_OTHER): Payer: 59 | Admitting: Hematology and Oncology

## 2011-05-12 DIAGNOSIS — C20 Malignant neoplasm of rectum: Secondary | ICD-10-CM

## 2011-05-26 ENCOUNTER — Ambulatory Visit (INDEPENDENT_AMBULATORY_CARE_PROVIDER_SITE_OTHER): Payer: 59 | Admitting: Gynecology

## 2011-05-26 DIAGNOSIS — N951 Menopausal and female climacteric states: Secondary | ICD-10-CM

## 2011-05-26 DIAGNOSIS — F520 Hypoactive sexual desire disorder: Secondary | ICD-10-CM

## 2011-06-14 ENCOUNTER — Encounter (HOSPITAL_BASED_OUTPATIENT_CLINIC_OR_DEPARTMENT_OTHER): Payer: 59 | Admitting: Hematology and Oncology

## 2011-06-14 DIAGNOSIS — Z452 Encounter for adjustment and management of vascular access device: Secondary | ICD-10-CM

## 2011-06-14 DIAGNOSIS — C20 Malignant neoplasm of rectum: Secondary | ICD-10-CM

## 2011-06-25 ENCOUNTER — Telehealth: Payer: Self-pay | Admitting: *Deleted

## 2011-07-02 ENCOUNTER — Encounter: Payer: 59 | Admitting: Hematology and Oncology

## 2011-07-05 NOTE — Telephone Encounter (Signed)
PT INFORMED THAT SHE NEEDS TO MAKE APPOINTMENT TO SEE DR. Glenetta Hew. PT STATES SHE WILL WAIT UNTIL HER LABS COME BACK FROM DR ODGWU OFFICE, THEN SHE WILL CALL TO MAKE OFFICE VISIT.

## 2011-08-23 ENCOUNTER — Ambulatory Visit (INDEPENDENT_AMBULATORY_CARE_PROVIDER_SITE_OTHER): Payer: 59 | Admitting: Family Medicine

## 2011-08-23 ENCOUNTER — Encounter: Payer: Self-pay | Admitting: Family Medicine

## 2011-08-23 DIAGNOSIS — F17200 Nicotine dependence, unspecified, uncomplicated: Secondary | ICD-10-CM | POA: Insufficient documentation

## 2011-08-23 DIAGNOSIS — Z720 Tobacco use: Secondary | ICD-10-CM

## 2011-08-23 DIAGNOSIS — J069 Acute upper respiratory infection, unspecified: Secondary | ICD-10-CM

## 2011-08-23 MED ORDER — HYDROCODONE-HOMATROPINE 5-1.5 MG/5ML PO SYRP: 5.0000 mL | ORAL_SOLUTION | Freq: Four times a day (QID) | ORAL | Status: AC | PRN

## 2011-08-23 MED ORDER — VARENICLINE TARTRATE 1 MG PO TABS: 1.0000 mg | ORAL_TABLET | Freq: Two times a day (BID) | ORAL | Status: AC

## 2011-08-23 NOTE — Progress Notes (Signed)
  Subjective:    Patient ID: Denise Michael, female    DOB: [birth year], 40 y.o.   MRN: [id number]  HPI Denise Michael is a 40 year old single female, smoker, who comes in today for evaluation of tobacco cessation programs, and a cold for 3 days.  She said had congestion, postnasal drip, and cough for 3 days.  No fever.  Clinically, E. She is stable from her treatment for rectal cancer.  She is now on a 6 month follow-up.  She smokes about 10 cigarettes a day, and would like to quit.  We put her on the chantix program a couple years ago, when she quit smoking.  General review of systems otherwise negative   Review of Systems    General review of systems otherwise, negative.  He's not had a period in 18 months.  She wants to talk about birth control.  Referred back to her GYN Objective:   Physical Exam  Well-developed well-nourished, female, in no acute distress.  Examination of the HEENT were negative.  Neck was supple.  No adenopathy.  Lungs are clear      Assessment & Plan:  Viral URI......... Tylenol lots of liquids, Hydromet p.r.n. Cough.  Tobacco abuse......... Begin chantix program.  Follow-up in one month

## 2011-08-23 NOTE — Patient Instructions (Signed)
Drink lots of liquids......... Tylenol as needed.......... Hydromet one half to 1 teaspoon up to 3 times a day as needed for cough.  No smoking.  Chantix one half tab daily.  Return in one month for follow-up

## 2011-09-03 ENCOUNTER — Encounter (HOSPITAL_BASED_OUTPATIENT_CLINIC_OR_DEPARTMENT_OTHER): Payer: 59 | Admitting: Hematology and Oncology

## 2011-09-03 ENCOUNTER — Other Ambulatory Visit: Payer: Self-pay | Admitting: Hematology and Oncology

## 2011-09-03 DIAGNOSIS — C20 Malignant neoplasm of rectum: Secondary | ICD-10-CM

## 2011-09-03 DIAGNOSIS — Z452 Encounter for adjustment and management of vascular access device: Secondary | ICD-10-CM

## 2011-09-03 LAB — CBC WITH DIFFERENTIAL/PLATELET
Eosinophils Absolute: 0.1 10*3/uL (ref 0.0–0.5)
MONO#: 0.3 10*3/uL (ref 0.1–0.9)
MONO%: 7.4 % (ref 0.0–14.0)
NEUT#: 2.9 10*3/uL (ref 1.5–6.5)
RBC: 4.04 10*6/uL (ref 3.70–5.45)
RDW: 12.5 % (ref 11.2–14.5)
WBC: 3.9 10*3/uL (ref 3.9–10.3)

## 2011-09-04 LAB — COMPREHENSIVE METABOLIC PANEL
Albumin: 4.7 g/dL (ref 3.5–5.2)
Alkaline Phosphatase: 61 U/L (ref 39–117)
CO2: 25 mEq/L (ref 19–32)
Glucose, Bld: 90 mg/dL (ref 70–99)
Potassium: 3.9 mEq/L (ref 3.5–5.3)
Sodium: 141 mEq/L (ref 135–145)
Total Protein: 6.8 g/dL (ref 6.0–8.3)

## 2011-09-15 ENCOUNTER — Encounter (HOSPITAL_BASED_OUTPATIENT_CLINIC_OR_DEPARTMENT_OTHER): Payer: 59 | Admitting: Hematology and Oncology

## 2011-09-15 ENCOUNTER — Other Ambulatory Visit: Payer: Self-pay | Admitting: Hematology and Oncology

## 2011-09-15 DIAGNOSIS — C20 Malignant neoplasm of rectum: Secondary | ICD-10-CM

## 2011-09-15 DIAGNOSIS — Z452 Encounter for adjustment and management of vascular access device: Secondary | ICD-10-CM

## 2011-09-23 ENCOUNTER — Ambulatory Visit: Payer: 59 | Admitting: Family Medicine

## 2011-11-19 ENCOUNTER — Ambulatory Visit (HOSPITAL_BASED_OUTPATIENT_CLINIC_OR_DEPARTMENT_OTHER): Payer: 59

## 2011-11-19 DIAGNOSIS — C189 Malignant neoplasm of colon, unspecified: Secondary | ICD-10-CM

## 2011-11-19 MED ORDER — HEPARIN SOD (PORK) LOCK FLUSH 100 UNIT/ML IV SOLN
500.0000 [IU] | Freq: Once | INTRAVENOUS | Status: AC
  Administered 2011-11-19: 500 [IU] via INTRAVENOUS
  Filled 2011-11-19: qty 5

## 2011-11-19 MED ORDER — SODIUM CHLORIDE 0.9 % IJ SOLN
10.0000 mL | INTRAMUSCULAR | Status: DC | PRN
  Administered 2011-11-19: 10 mL via INTRAVENOUS
  Filled 2011-11-19: qty 10

## 2011-11-22 ENCOUNTER — Telehealth: Payer: Self-pay | Admitting: *Deleted

## 2011-11-22 NOTE — Telephone Encounter (Signed)
Received call from pt re:  Pt will be moving to  [place],  [place] early Feb.  To start a new job on 12/27/11.    Pt would like to know if CT scans and f/u appt with md could be moved up to earlier date and time before Feb. 11 ;   Informed pt that md would be notified on 1/8 , and scheduler will contact pt with md's decision. Pt also stated she has noticed occasional numbness in her arms and on buttocks near surgical area.    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 Instructed pt to notify her primary for numbness issues.    Pt stated she did not think it is cancer related , but just wanted to inform md. Pt's    Phone       [phone]

## 2011-11-23 ENCOUNTER — Other Ambulatory Visit: Payer: Self-pay | Admitting: *Deleted

## 2011-11-24 ENCOUNTER — Telehealth: Payer: Self-pay | Admitting: Hematology and Oncology

## 2011-11-24 NOTE — Telephone Encounter (Signed)
lmonvm for pt re appt changes. New appts for lb/ct 1/25 and f/u w/RJ 1/29. New appts mailed today.

## 2011-12-02 ENCOUNTER — Emergency Department (HOSPITAL_COMMUNITY): Payer: 59

## 2011-12-02 ENCOUNTER — Other Ambulatory Visit: Payer: Self-pay

## 2011-12-02 ENCOUNTER — Telehealth: Payer: Self-pay | Admitting: *Deleted

## 2011-12-02 ENCOUNTER — Encounter (HOSPITAL_COMMUNITY): Payer: Self-pay | Admitting: *Deleted

## 2011-12-02 ENCOUNTER — Emergency Department (HOSPITAL_COMMUNITY)
Admission: EM | Admit: 2011-12-02 | Discharge: 2011-12-02 | Disposition: A | Discharge status: Home / Self Care | Payer: 59 | Attending: Emergency Medicine | Admitting: Emergency Medicine

## 2011-12-02 DIAGNOSIS — R0602 Shortness of breath: Secondary | ICD-10-CM | POA: Insufficient documentation

## 2011-12-02 DIAGNOSIS — N390 Urinary tract infection, site not specified: Secondary | ICD-10-CM | POA: Insufficient documentation

## 2011-12-02 DIAGNOSIS — R55 Syncope and collapse: Secondary | ICD-10-CM

## 2011-12-02 DIAGNOSIS — Z85048 Personal history of other malignant neoplasm of rectum, rectosigmoid junction, and anus: Secondary | ICD-10-CM | POA: Insufficient documentation

## 2011-12-02 DIAGNOSIS — O0883 Urinary tract infection following an ectopic and molar pregnancy: Secondary | ICD-10-CM

## 2011-12-02 DIAGNOSIS — IMO0002 Reserved for concepts with insufficient information to code with codable children: Secondary | ICD-10-CM | POA: Insufficient documentation

## 2011-12-02 LAB — URINALYSIS, ROUTINE W REFLEX MICROSCOPIC
Glucose, UA: NEGATIVE mg/dL
Protein, ur: NEGATIVE mg/dL

## 2011-12-02 LAB — DIFFERENTIAL
Eosinophils Absolute: 0.1 10*3/uL (ref 0.0–0.7)
Lymphocytes Relative: 13 % (ref 12–46)
Lymphs Abs: 0.8 10*3/uL (ref 0.7–4.0)
Neutro Abs: 4.6 10*3/uL (ref 1.7–7.7)
Neutrophils Relative %: 79 % — ABNORMAL HIGH (ref 43–77)

## 2011-12-02 LAB — CBC
MCH: 32.9 pg (ref 26.0–34.0)
Platelets: 191 10*3/uL (ref 150–400)
RBC: 4.14 MIL/uL (ref 3.87–5.11)
WBC: 5.9 10*3/uL (ref 4.0–10.5)

## 2011-12-02 LAB — URINE CULTURE: Culture: NO GROWTH

## 2011-12-02 LAB — POCT I-STAT, CHEM 8
BUN: 11 mg/dL (ref 6–23)
Calcium, Ion: 1.2 mmol/L (ref 1.12–1.32)
Creatinine, Ser: 0.7 mg/dL (ref 0.50–1.10)
TCO2: 25 mmol/L (ref 0–100)

## 2011-12-02 LAB — PREGNANCY, URINE: Preg Test, Ur: NEGATIVE

## 2011-12-02 LAB — URINE MICROSCOPIC-ADD ON

## 2011-12-02 MED ORDER — NITROFURANTOIN MONOHYD MACRO 100 MG PO CAPS: 100.0000 mg | ORAL_CAPSULE | Freq: Two times a day (BID) | ORAL | Status: AC

## 2011-12-02 MED ORDER — IOHEXOL 300 MG/ML  SOLN
100.0000 mL | Freq: Once | INTRAMUSCULAR | Status: AC | PRN
  Administered 2011-12-02: 80 mL via INTRAVENOUS

## 2011-12-02 NOTE — ED Provider Notes (Signed)
History     CSN: [id number]  Arrival date & time 12/02/11  1358   First MD Initiated Contact with Patient 12/02/11 1407      Chief Complaint  Patient presents with  . Anxiety  . Hyperventilating    (Consider location/radiation/quality/duration/timing/severity/associated sxs/prior treatment) HPI Comments: Patient reports she was sitting at her desk, started getting "tunnel vision," lightheaded, feeling as if she was going to pass out, then suddenly became very short of breath and lost consciousness.  States she vaguely remembers her boss asking her questions. Sister states she was told that patient had passed out and was "hyperventiliating."  Per notes, patient was found slumped over desk, with her body jerking, decreased consciousness.  Patient currently states she feels a little weak and her fingers are tingling on both hands, but denies CP, SOB, cough.  Denies fevers or recent illness.  States she has had tingling in her bilateral hands for awhile now, her doctor is aware.  Denies weakness of the extremities.    Patient is a 41 y.o. female presenting with anxiety. The history is provided by the patient and the EMS personnel.  Anxiety Pertinent negatives include no abdominal pain, chest pain, coughing, fever, nausea, vomiting or weakness.    Past Medical History  Diagnosis Date  . Rectal cancer     rectal ca dx 11/2009    Past Surgical History  Procedure Date  . Rectal surgery 03/2010    TO REMOVE CANCEROUS TUMOR IN RECTUM AND ESTAB TEMPORARY ILIEOSTOMY  . Reverse ilieostomy 10/2010    Family History  Problem Relation Age of Onset  . Graves' disease Mother   . Hypertension Father   . Heart disease Maternal Grandmother   . Cancer Maternal Grandfather     BLADDER/PROSTATE  . Breast cancer Paternal Grandmother     History  Substance Use Topics  . Smoking status: Current Some Day Smoker -- 0.5 packs/day    Types: Cigarettes  . Smokeless tobacco: Never Used  . Alcohol  Use: No    OB History    Grav Para Term Preterm Abortions TAB SAB Ect Mult Living                  Review of Systems  Constitutional: Negative for fever.  Respiratory: Negative for cough.   Cardiovascular: Negative for chest pain.  Gastrointestinal: Negative for nausea, vomiting and abdominal pain.  Neurological: Negative for weakness.  All other systems reviewed and are negative.    Allergies  Review of patient's allergies indicates no known allergies.  Home Medications   Current Outpatient Rx  Name Route Sig Dispense Refill  . CETIRIZINE HCL 10 MG PO TABS Oral Take 10 mg by mouth daily.    . CELEXA PO Oral Take by mouth.      . ESTRADIOL 0.52 MG/0.87 GM (0.06%) TD GEL Transdermal Place onto the skin.      Marland Kitchen MEDROXYPROGESTERONE ACETATE 10 MG PO TABS Oral Take 10 mg by mouth daily. TAKES MONTHLY X 10 DAYS IF NO MENSES.       BP 109/77  Pulse 85  Temp 98.6 F (37 C)  Resp 18  Physical Exam  Nursing note and vitals reviewed. Constitutional: She is oriented to person, place, and time. She appears well-developed and well-nourished.  HENT:  Head: Normocephalic and atraumatic.  Neck: Neck supple.  Cardiovascular: Normal rate, regular rhythm and normal heart sounds.   Pulmonary/Chest: Breath sounds normal. No respiratory distress. She has no wheezes. She has no  rales. She exhibits no tenderness.  Abdominal: Soft. Bowel sounds are normal. She exhibits no distension. There is no tenderness. There is no rebound and no guarding.  Musculoskeletal: Normal range of motion. She exhibits no edema.  Neurological: She is alert and oriented to person, place, and time.  Psychiatric: She has a normal mood and affect.    ED Course  Procedures (including critical care time)   Labs Reviewed  PREGNANCY, URINE  CBC  DIFFERENTIAL  URINALYSIS, ROUTINE W REFLEX MICROSCOPIC  I-STAT, CHEM 8   Dg Chest 2 View  12/02/2011  *RADIOLOGY REPORT*  Clinical Data: Syncope  CHEST - 2 VIEW   Comparison: 05/05/2011  Findings: Cardiomediastinal silhouette is stable.  Right Port-A- Cath stable in position. No acute infiltrate or pleural effusion. No pulmonary edema.  Bony thorax is unremarkable.  IMPRESSION: No active disease.  No significant change.  Original Report Authenticated By: Natasha Mead, M.D.    Date: 12/02/2011  Rate: 74  Rhythm: normal sinus rhythm  QRS Axis: normal  Intervals: normal  ST/T Wave abnormalities: normal  Conduction Disutrbances:none  Narrative Interpretation:   Old EKG Reviewed: none available  3:38 PM Patient reports she continues to feel tired, but otherwise has no complaints.    3:41 PM Discussed patient with Dr Denton Lank.  If all tests are normal, plan is for discharge home.    No diagnosis found.    MDM  41 year old patient with hx cancer, now in remission, presents with syncopal episode with lightheadedness and SOB.  Patient "tired" but otherwise asymptomatic on arrival.  Patient signed out to Dr Rubin Payor and Grant Fontana, PA-C, at change of shift.  If all tests are normal, anticipate discharge home.         Rise Patience, [place] 12/02/11 1601

## 2011-12-02 NOTE — ED Provider Notes (Signed)
  Physical Exam  BP 110/80  Pulse 71  Temp(Src) 98.6 F (37 C) (Oral)  Resp 18  SpO2 99%  Physical Exam  ED Course  Procedures  MDM Signout received from [place], [place]. VSS. CTA chest negative. Pt's serum labs unremarkable. Mod leukocytes on UA with 7-10 whites, few bacteria. Nitrite neg. Urine sent for cx. Findings discussed with pt. She has an upcoming appt with onc which she was instructed to keep. Return precautions discussed.      Grant Fontana, [place] 12/02/11 2256

## 2011-12-02 NOTE — Telephone Encounter (Signed)
Patient's co-worker called requesting her medication list.  Informed her I can't speak with her or give this information.  Co-worker reports patient "has fallen at work in the floor having seizure like activity and paramedics have been called".  Asked to speak with paramedic.  Med list given per [facility] and [facility] and that patient seen her for rectal cancer referred to [facility] by Dr. Kinnie Scales.  Will notify providers.

## 2011-12-02 NOTE — ED Notes (Signed)
ZOX:[id number]> Expected date:12/02/11<BR> Expected time: 1:40 PM<BR> Means of arrival:Ambulance<BR> Comments:<BR> EMS 10 GC, 40 yof hyperventilation/ anxiety

## 2011-12-02 NOTE — ED Notes (Signed)
Port accessed. Kerman Passey, RN.  Blood drawn

## 2011-12-02 NOTE — ED Provider Notes (Signed)
Medical screening examination/treatment/procedure(s) were performed by non-physician practitioner and as supervising physician I was immediately available for consultation/collaboration.  Mable Lashley R. Willard Farquharson, MD 12/02/11 2307 

## 2011-12-02 NOTE — ED Notes (Signed)
Per [facility], "upon arrival, pt found slumped over desk & was jerking, immediately responded upon CBG check, applied 2 L/[place]."

## 2011-12-03 ENCOUNTER — Other Ambulatory Visit: Payer: Self-pay | Admitting: *Deleted

## 2011-12-03 ENCOUNTER — Telehealth: Payer: Self-pay | Admitting: *Deleted

## 2011-12-03 NOTE — Telephone Encounter (Signed)
Spoke with Denise Michael in radiology CT scan dept and informed her re:  Cancelled  CT Chest scheduled for 12/10/11.  Pt still needs  CT  Abdomen/Pelvis done . Pt had  CT Angio Chest  scan done while in ER on 12/02/11.

## 2011-12-10 ENCOUNTER — Ambulatory Visit (HOSPITAL_COMMUNITY)
Admission: RE | Admit: 2011-12-10 | Discharge: 2011-12-10 | Disposition: A | Discharge status: Home / Self Care | Payer: 59 | Source: Ambulatory Visit | Attending: Hematology and Oncology | Admitting: Hematology and Oncology

## 2011-12-10 ENCOUNTER — Other Ambulatory Visit (HOSPITAL_BASED_OUTPATIENT_CLINIC_OR_DEPARTMENT_OTHER): Payer: 59 | Admitting: Lab

## 2011-12-10 DIAGNOSIS — Z980 Intestinal bypass and anastomosis status: Secondary | ICD-10-CM | POA: Insufficient documentation

## 2011-12-10 DIAGNOSIS — C20 Malignant neoplasm of rectum: Secondary | ICD-10-CM

## 2011-12-10 DIAGNOSIS — Z923 Personal history of irradiation: Secondary | ICD-10-CM | POA: Insufficient documentation

## 2011-12-10 DIAGNOSIS — Z9221 Personal history of antineoplastic chemotherapy: Secondary | ICD-10-CM | POA: Insufficient documentation

## 2011-12-10 DIAGNOSIS — Z452 Encounter for adjustment and management of vascular access device: Secondary | ICD-10-CM

## 2011-12-10 LAB — CBC WITH DIFFERENTIAL/PLATELET
BASO%: 0.5 % (ref 0.0–2.0)
EOS%: 1.5 % (ref 0.0–7.0)
MCH: 32.8 pg (ref 25.1–34.0)
MCHC: 35.1 g/dL (ref 31.5–36.0)
MONO%: 7.4 % (ref 0.0–14.0)
NEUT%: 74.8 % (ref 38.4–76.8)
RDW: 12.1 % (ref 11.2–14.5)
lymph#: 0.7 10*3/uL — ABNORMAL LOW (ref 0.9–3.3)

## 2011-12-10 LAB — CMP (CANCER CENTER ONLY)
ALT(SGPT): 17 U/L (ref 10–47)
AST: 15 U/L (ref 11–38)
Albumin: 4.3 g/dL (ref 3.3–5.5)
Calcium: 9.3 mg/dL (ref 8.0–10.3)
Chloride: 100 mEq/L (ref 98–108)
Potassium: 3.5 mEq/L (ref 3.3–4.7)
Sodium: 142 mEq/L (ref 128–145)

## 2011-12-10 MED ORDER — IOHEXOL 300 MG/ML  SOLN
100.0000 mL | Freq: Once | INTRAMUSCULAR | Status: AC | PRN
  Administered 2011-12-10: 100 mL via INTRAVENOUS

## 2011-12-11 NOTE — ED Provider Notes (Signed)
Medical screening examination/treatment/procedure(s) were conducted as a shared visit with non-physician practitioner(s) and myself.  I personally evaluated the patient during the encounter Pt w episode hyperventilation, tunnel vision, numbness hands, felt faint. In nsr in ed. Rrr. Chest cta. Suspect hyperventilation/anxiety episode. pcp f/u.   Suzi Roots, MD 12/11/11 [phone]

## 2011-12-14 ENCOUNTER — Telehealth: Payer: Self-pay | Admitting: Hematology and Oncology

## 2011-12-14 ENCOUNTER — Ambulatory Visit (HOSPITAL_BASED_OUTPATIENT_CLINIC_OR_DEPARTMENT_OTHER): Payer: 59 | Admitting: Physician Assistant

## 2011-12-14 ENCOUNTER — Other Ambulatory Visit: Payer: Self-pay | Admitting: Hematology and Oncology

## 2011-12-14 VITALS — BP 113/74 | HR 86 | Temp 97.3°F | Ht 66.0 in | Wt 134.5 lb

## 2011-12-14 DIAGNOSIS — N6009 Solitary cyst of unspecified breast: Secondary | ICD-10-CM

## 2011-12-14 DIAGNOSIS — C20 Malignant neoplasm of rectum: Secondary | ICD-10-CM

## 2011-12-14 DIAGNOSIS — Z1231 Encounter for screening mammogram for malignant neoplasm of breast: Secondary | ICD-10-CM

## 2011-12-14 NOTE — Progress Notes (Signed)
CC:   Griffith Citron, M.D. Jeffrey A. Tawanna Cooler, MD Rae Halsted, MD  IDENTIFYING STATEMENT:  [name] is a 41 year old white female with rectal adenocarcinoma who presents for followup and to review recent labs and CT results.  INTERIM HISTORY:  Ms. Schnackenberg reports her last clinic visit in October of 2012 she has had overall normal energy level.  No fevers, chills, or night sweats.  She states she did have an episode of dyspnea and a near syncopal episode that occurred at work on December 02, 2011 and she was referred to the emergency room where she underwent chest x- ray PA and lateral which revealed no active disease and no significant change.  She also had an angio CT of the chest on December 02, 2011. This revealed a normal CTA of the chest, no acute abnormalities.  The patient states she has had no further episodes of dyspnea or syncopal episodes.  Currently the patient reports normal appetite.  She has had no issues with nausea, vomiting, constipation, or diarrhea.  No dysuria, no frequency, or hematuria.  No alteration in sensation or balance or problems with swelling of extremities.  Current medications are reviewed and recorded.  PHYSICAL EXAM:  Temperature today is 97.3, heart rate 86, respirations 18, blood pressure 113/74, weight 134.5 pounds.  General:  This is a well-developed, well-nourished, white female, in no acute distress. HEENT:  Sclerae nonicteric.  There is no oral thrush or mucositis. Skin:  No rashes or lesions.  Lymph:  No cervical, supraclavicular, axillary, or inguinal lymphadenopathy.  Cardiac:  Regular rate and rhythm without murmurs or gallops.  Peripheral pulses are 2+.  Right subclavian Port-A-Cath without signs of infection.  Chest:  Lungs clear to auscultation.  Abdomen:  Positive bowel sounds.  Soft, nontender, nondistended.  No organomegaly.  Extremities:  No edema, cyanosis or calf tenderness.  Neurologic:  Alert and oriented x3.   Strength, sensation, and coordination all grossly intact.  LABORATORY DATA:  Laboratory data from December 10, 2011:  CBC with diff reveals white blood count of 4.3, hemoglobin 14.4, hematocrit of 41, platelets of 196, ANC of 3.2 and MCV of 93.6.  Chemistries reveal a sodium of 142, potassium 3.5, chloride 100, BUN 15, creatinine 0.5, glucose of 94, bilirubin 0.6, alkaline phosphatase 62, AST 15, ALT 17, total protein 7.2, albumin 4.3, and calcium of 9.3.  Tumor marker CEA of 0.9.  RADIOGRAPHIC STUDIES:  The patient had a CT of the abdomen and pelvis with contrast on December 10, 2011.  This revealed no evidence of local rectal cancer recurrence or metastasis in the abdomen or pelvis, stable low rectal anastomosis.  IMPRESSION/PLAN: 1. Denise Michael is a 41 year old white female who is status     post abdominoperineal resection with ileostomy at [facility] on Apr 08, 2010 for rectal adenocarcinoma.  Postsurgical pathology revealed a     tumor measuring 0.5 cm with no lymph node involvement or     lymphovascular invasion.  Surgical margins were clear.  She     received neoadjuvant 5-FU with radiation therapy and following     surgery received adjuvant chemotherapy in the form of XELOX for 1     cycle and then was not able to tolerate oxaliplatin.  She continued     with Xeloda for 8 cycles.  She has had no evidence of disease     recurrence since that time.  Her most recent radiographic studies     are as  above.  These have been reviewed by Dr. Dalene Carrow and are     reviewed with the patient today. 2. Of note, the patient's last colonoscopy was June 07, 2011.  This     revealed no colorectal neoplasia.  There was diverticula at the     hepatic flexure, internal hemorrhoids medium.  Recommendation was     for repeat colonoscopy in 2 years time.  This was under the care of     Dr. Sharrell Ku. 3. The patient last had a mammogram in March of 2012, as well as     ultrasound of the  left breast.  The ultrasound revealed upper left     breast cyst which corresponds to screening mammogram  findings.     Recommendation was for bilateral mammograms in 1 year's time and     the patient will have repeat mammograms in March of 2013. 4. The patient requested Port-A-Cath removal and per Dr. Dalene Carrow okay     for patient to have her Port-A-Cath removed.  We will schedule this     through interventional radiology. 5. Per Dr. Dalene Carrow, the patient will be scheduled for a followup visit     in 6 month's time.  A few days before this, we will reassess CBC     with diff, BMET and CEA.  The patient is advised to call in the     interim if any questions or problems.    ______________________________ Michail Sermon, MSN, ANP, BC RH/MEDQ  D:  12/14/2011  T:  12/14/2011  Job:  [id number]

## 2011-12-14 NOTE — Progress Notes (Signed)
This office note has been dictated.

## 2011-12-14 NOTE — Telephone Encounter (Signed)
Gv pt appt for [id number].  scheduled pt for port removal on 12/20/2011 @ 12noon @ [facility]

## 2011-12-15 ENCOUNTER — Other Ambulatory Visit: Payer: Self-pay | Admitting: Radiology

## 2011-12-16 ENCOUNTER — Encounter (HOSPITAL_COMMUNITY): Payer: Self-pay | Admitting: Pharmacy Technician

## 2011-12-20 ENCOUNTER — Ambulatory Visit (HOSPITAL_COMMUNITY)
Admission: RE | Admit: 2011-12-20 | Discharge: 2011-12-20 | Disposition: A | Discharge status: Home / Self Care | Payer: 59 | Source: Ambulatory Visit | Attending: Physician Assistant | Admitting: Physician Assistant

## 2011-12-20 ENCOUNTER — Other Ambulatory Visit: Payer: Self-pay | Admitting: Family Medicine

## 2011-12-20 DIAGNOSIS — Z452 Encounter for adjustment and management of vascular access device: Secondary | ICD-10-CM | POA: Insufficient documentation

## 2011-12-20 DIAGNOSIS — Z79899 Other long term (current) drug therapy: Secondary | ICD-10-CM | POA: Insufficient documentation

## 2011-12-20 DIAGNOSIS — F17200 Nicotine dependence, unspecified, uncomplicated: Secondary | ICD-10-CM | POA: Insufficient documentation

## 2011-12-20 DIAGNOSIS — I10 Essential (primary) hypertension: Secondary | ICD-10-CM | POA: Insufficient documentation

## 2011-12-20 DIAGNOSIS — C20 Malignant neoplasm of rectum: Secondary | ICD-10-CM

## 2011-12-20 MED ORDER — MIDAZOLAM HCL 5 MG/5ML IJ SOLN
INTRAMUSCULAR | Status: AC | PRN
  Administered 2011-12-20: 2 mg via INTRAVENOUS

## 2011-12-20 MED ORDER — SODIUM CHLORIDE 0.9 % IV SOLN: Freq: Once | INTRAVENOUS | Status: DC

## 2011-12-20 MED ORDER — FENTANYL CITRATE 0.05 MG/ML IJ SOLN
INTRAMUSCULAR | Status: AC | PRN
  Administered 2011-12-20: 100 ug via INTRAVENOUS

## 2011-12-20 MED ORDER — CEFAZOLIN SODIUM 1-5 GM-% IV SOLN
1.0000 g | INTRAVENOUS | Status: AC
  Administered 2011-12-20: 1 g via INTRAVENOUS
  Filled 2011-12-20: qty 50

## 2011-12-20 MED ORDER — CITALOPRAM HYDROBROMIDE 20 MG PO TABS: 20.0000 mg | ORAL_TABLET | Freq: Every day | ORAL | Status: DC

## 2011-12-20 MED ORDER — FENTANYL CITRATE 0.05 MG/ML IJ SOLN
INTRAMUSCULAR | Status: AC
  Filled 2011-12-20: qty 4

## 2011-12-20 MED ORDER — MIDAZOLAM HCL 2 MG/2ML IJ SOLN
INTRAMUSCULAR | Status: AC
  Filled 2011-12-20: qty 2

## 2011-12-20 MED ORDER — LIDOCAINE HCL 1 % IJ SOLN
INTRAMUSCULAR | Status: AC
  Filled 2011-12-20: qty 20

## 2011-12-20 NOTE — Procedures (Signed)
Procedure:  Portacath removal Findings:  Uncomplicated right chest port removal.

## 2011-12-20 NOTE — Telephone Encounter (Signed)
rx sent

## 2011-12-20 NOTE — Telephone Encounter (Signed)
Pt need generic celexa #30 [facility] on [address]. This med was originally prescribed by her pyschiatrist who retired

## 2011-12-20 NOTE — Telephone Encounter (Signed)
R.  please call,,,,,,,,,,,, Celexa 20 mg dispense 100 tabs directions one each bedtime refills x2

## 2011-12-20 NOTE — H&P (Signed)
Agree.  For port removal today. 

## 2011-12-20 NOTE — H&P (Signed)
Denise Michael is an 41 y.o. female.   Chief Complaint: "I'm here to have my port a cath removed" [id number] with history of rectal carcinoma who has completed therapy presents today for port a cath removal.  Past Medical History  Diagnosis Date  . Rectal cancer     rectal ca dx 11/2009    Past Surgical History  Procedure Date  . Rectal surgery 03/2010    TO REMOVE CANCEROUS TUMOR IN RECTUM AND ESTAB TEMPORARY ILIEOSTOMY  . Reverse ilieostomy 10/2010    Family History  Problem Relation Age of Onset  . Graves' disease Mother   . Hypertension Father   . Heart disease Maternal Grandmother   . Cancer Maternal Grandfather     BLADDER/PROSTATE  . Breast cancer Paternal Grandmother    Social History:  reports that she has been smoking Cigarettes.  She has been smoking about .5 packs per day. She has never used smokeless tobacco. She reports that she does not drink alcohol or use illicit drugs.  Allergies: No Known Allergies  Medications Prior to Admission  Medication Sig Dispense Refill  . citalopram (CELEXA) 10 MG tablet Take 5 mg by mouth at bedtime.       Medications Prior to Admission  Medication Dose Route Frequency Provider Last Rate Last Dose  . 0.9 %  sodium chloride infusion   Intravenous Once Robet Leu, PA      . ceFAZolin (ANCEF) IVPB 1 g/50 mL premix  1 g Intravenous to XRAY Robet Leu, PA       Results for orders placed in visit on 12/10/11  COMPREHENSIVE METABOLIC PANEL      Component Value Range   Sodium 142  128 - 145 (mEq/L)   Potassium 3.5  3.3 - 4.7 (mEq/L)   Chloride 100  98 - 108 (mEq/L)   CO2 29  18 - 33 (mEq/L)   Glucose, Bld 94  73 - 118 (mg/dL)   BUN, Bld 15  7 - 22 (mg/dL)   Creat 0.5 (*) 0.6 - 1.2 (mg/dl)   Total Bilirubin 0.86  0.20 - 1.60 (mg/dl)   Alkaline Phosphatase 62  26 - 84 (U/L)   AST 15  11 - 38 (U/L)   ALT(SGPT) 17  10 - 47 (U/L)   Total Protein 7.2  6.4 - 8.1 (g/dL)   Albumin 4.3  3.3 - 5.5 (g/dL)   Calcium 9.3   8.0 - 10.3 (mg/dL)  CBC WITH DIFFERENTIAL      Component Value Range   WBC 4.3  3.9 - 10.3 (10e3/uL)   NEUT# 3.2  1.5 - 6.5 (10e3/uL)   HGB 14.4  11.6 - 15.9 (g/dL)   HCT 57.8  46.9 - 62.9 (%)   Platelets 196  145 - 400 (10e3/uL)   MCV 93.6  79.5 - 101.0 (fL)   MCH 32.8  25.1 - 34.0 (pg)   MCHC 35.1  31.5 - 36.0 (g/dL)   RBC 5.28  4.13 - 2.44 (10e6/uL)   RDW 12.1  11.2 - 14.5 (%)   lymph# 0.7 (*) 0.9 - 3.3 (10e3/uL)   MONO# 0.3  0.1 - 0.9 (10e3/uL)   Eosinophils Absolute 0.1  0.0 - 0.5 (10e3/uL)   Basophils Absolute 0.0  0.0 - 0.1 (10e3/uL)   NEUT% 74.8  38.4 - 76.8 (%)   LYMPH% 15.8  14.0 - 49.7 (%)   MONO% 7.4  0.0 - 14.0 (%)   EOS% 1.5  0.0 - 7.0 (%)   BASO%  0.5  0.0 - 2.0 (%)  CEA      Component Value Range   CEA 0.9  0.0 - 5.0 (ng/mL)     Review of Systems  Constitutional: Negative for fever and chills.  Respiratory: Negative for cough and shortness of breath.   Cardiovascular: Negative for chest pain.  Gastrointestinal: Negative for nausea, vomiting and abdominal pain.  Neurological: Negative for headaches.  Endo/Heme/Allergies: Does not bruise/bleed easily.    Temperature 97.6 F (36.4 C). Physical Exam  Constitutional: She is oriented to person, place, and time. She appears well-developed and well-nourished.  Cardiovascular: Normal rate and regular rhythm.   Respiratory: Effort normal.       Slightly diminished BS left base  GI: Soft. Bowel sounds are normal. She exhibits no distension. There is no tenderness.  Musculoskeletal: Normal range of motion. She exhibits no edema.  Neurological: She is alert and oriented to person, place, and time.     Assessment/Plan Patient with history of rectal carcinoma who has completed therapy; plan is for removal of port a cath.  Melisssa Donner,D KEVIN 12/20/2011, 1:09 PM

## 2012-01-11 ENCOUNTER — Other Ambulatory Visit: Payer: 59 | Admitting: Lab

## 2012-01-11 ENCOUNTER — Other Ambulatory Visit (HOSPITAL_COMMUNITY): Payer: 59

## 2012-01-13 ENCOUNTER — Ambulatory Visit: Payer: 59 | Admitting: Hematology and Oncology

## 2012-04-19 ENCOUNTER — Other Ambulatory Visit: Payer: Self-pay | Admitting: Hematology and Oncology

## 2012-04-19 DIAGNOSIS — Z1231 Encounter for screening mammogram for malignant neoplasm of breast: Secondary | ICD-10-CM

## 2012-05-03 ENCOUNTER — Telehealth: Payer: Self-pay | Admitting: Certified Registered Nurse Anesthetist

## 2012-05-03 NOTE — Telephone Encounter (Signed)
Pt. Left message concerning bowel changes.   Spoke to pt., pt. Informed me that she was having bleeding  with stool since ileostomy reversal.  Pt. Stated that this could be from irritation since she has multiple movement daily.  Dr. Marton Redwood aware and okay with keeping appt. for July.  Pt. will continue to monitor bleeding and use ointment to area to address irritation.  Advised pt. to montior and call [facility] and surgeon's office back if symptoms worsen.  Pt. agreed with plan.  HL

## 2012-06-06 ENCOUNTER — Encounter: Payer: Self-pay | Admitting: *Deleted

## 2012-06-12 ENCOUNTER — Other Ambulatory Visit (HOSPITAL_BASED_OUTPATIENT_CLINIC_OR_DEPARTMENT_OTHER): Payer: Managed Care, Other (non HMO) | Admitting: Lab

## 2012-06-12 ENCOUNTER — Ambulatory Visit
Admission: RE | Admit: 2012-06-12 | Discharge: 2012-06-12 | Disposition: A | Discharge status: Home / Self Care | Payer: Managed Care, Other (non HMO) | Source: Ambulatory Visit | Attending: Hematology and Oncology | Admitting: Hematology and Oncology

## 2012-06-12 DIAGNOSIS — Z1231 Encounter for screening mammogram for malignant neoplasm of breast: Secondary | ICD-10-CM

## 2012-06-12 DIAGNOSIS — C20 Malignant neoplasm of rectum: Secondary | ICD-10-CM

## 2012-06-12 LAB — CBC WITH DIFFERENTIAL/PLATELET
BASO%: 0.9 % (ref 0.0–2.0)
HCT: 38.9 % (ref 34.8–46.6)
LYMPH%: 29 % (ref 14.0–49.7)
MCHC: 34.1 g/dL (ref 31.5–36.0)
MCV: 93.9 fL (ref 79.5–101.0)
MONO#: 0.3 10*3/uL (ref 0.1–0.9)
MONO%: 10.7 % (ref 0.0–14.0)
NEUT%: 52.7 % (ref 38.4–76.8)
Platelets: 171 10*3/uL (ref 145–400)
RBC: 4.14 10*6/uL (ref 3.70–5.45)
WBC: 3.1 10*3/uL — ABNORMAL LOW (ref 3.9–10.3)

## 2012-06-12 LAB — COMPREHENSIVE METABOLIC PANEL
Alkaline Phosphatase: 65 U/L (ref 39–117)
BUN: 18 mg/dL (ref 6–23)
CO2: 28 mEq/L (ref 19–32)
Creatinine, Ser: 0.73 mg/dL (ref 0.50–1.10)
Glucose, Bld: 91 mg/dL (ref 70–99)
Sodium: 140 mEq/L (ref 135–145)
Total Bilirubin: 0.4 mg/dL (ref 0.3–1.2)
Total Protein: 6 g/dL (ref 6.0–8.3)

## 2012-06-12 LAB — CEA: CEA: 0.7 ng/mL (ref 0.0–5.0)

## 2012-06-13 ENCOUNTER — Telehealth: Payer: Self-pay | Admitting: Hematology and Oncology

## 2012-06-13 ENCOUNTER — Encounter: Payer: Self-pay | Admitting: Hematology and Oncology

## 2012-06-13 ENCOUNTER — Other Ambulatory Visit: Payer: 59

## 2012-06-13 ENCOUNTER — Ambulatory Visit (HOSPITAL_BASED_OUTPATIENT_CLINIC_OR_DEPARTMENT_OTHER): Payer: Managed Care, Other (non HMO) | Admitting: Hematology and Oncology

## 2012-06-13 VITALS — BP 115/76 | HR 85 | Temp 98.1°F | Ht 66.0 in | Wt 137.5 lb

## 2012-06-13 DIAGNOSIS — C20 Malignant neoplasm of rectum: Secondary | ICD-10-CM

## 2012-06-13 NOTE — Telephone Encounter (Signed)
Gave pt appt for for January 2014 lab and Ct, pt wants to drink water based contrast from [facility] Radiology. [facility] ENT to refer patient, they will call me back, gave referral to HIM to fax notes

## 2012-06-13 NOTE — Progress Notes (Signed)
Hematology and Oncology Follow Up Visit  Denise Michael [id number] October 21, 1971 40 y.o. 06/13/2012 12:44 PM  CC: Sharrell Ku M.D.   Interim History: The patient currently resides in [place]. She is here for followup visit. Has not had no specific GI complaints besides loose frequent bowel movements are since his surgery. Is known to have a  history of irritable bowel syndrome. Her weight is stable. She denies pain.  Medications: I have reviewed the patient's current medications.  Allergies: No Known Allergies  Past Medical History, Surgical history, Social history, and Family History were reviewed and updated.  Review of Systems: Recently noticed sensation in her throat. Also noted frequent bowel movements without melena or hematochezia. Weight stable.  Physical Exam: Blood pressure 115/76, pulse 85, temperature 98.1 F (36.7 C), temperature source Oral, height 5\' 6"  (1.676 m), weight 137 lb 8 oz (62.37 kg).     General appearance: alert and cooperative Head: atraumatic Neck: Supple. No adenopathy. Chest: Clear to percussion and auscultation. CVS: First and second heart sounds present, irregular. Abdomen: Soft nontender, no palpable masses, bowel sounds present. Extremities: No calf tenderness, no edema, pulses present and symmetrical. Lymph nodes: No adenopathy   Lab Results:  Lab Results  Component Value Date   WBC 3.1* 06/12/2012   HGB 13.2 06/12/2012   HCT 38.9 06/12/2012   MCV 93.9 06/12/2012   PLT 171 06/12/2012     Chemistry      Component Value Date/Time   NA 140 06/12/2012 0821   NA 142 12/10/2011 1234   K 4.5 06/12/2012 0821   K 3.5 12/10/2011 1234   CL 107 06/12/2012 0821   CL 100 12/10/2011 1234   CO2 28 06/12/2012 0821   CO2 29 12/10/2011 1234   BUN 18 06/12/2012 0821   BUN 15 12/10/2011 1234   CREATININE 0.73 06/12/2012 0821   CREATININE 0.5* 12/10/2011 1234      Component Value Date/Time   CALCIUM 9.4 06/12/2012 0821   CALCIUM 9.3 12/10/2011  1234   ALKPHOS 65 06/12/2012 0821   ALKPHOS 62 12/10/2011 1234   AST 17 06/12/2012 0821   AST 15 12/10/2011 1234   ALT 15 06/12/2012 0821   BILITOT 0.4 06/12/2012 0821   BILITOT 0.60 12/10/2011 1234     CEA 0.7  Impression and Plan: 1. Patient is a 41 year old woman who is status post abdominal perineal resection with ileostomy at [facility] on 04/08/2003 rectal adenocarcinoma. Surgical pathology revealed a tumor measuring 0.5 cm with no lymph node involvement or lymphovascular invasion. Surgical margins were clear. She received neoadjuvant 5-FU and radiation therapy and following surgery received adjuvant chemotherapy in the form of XELOX for 1 cycle [unable to tolerate oxaliplatin]. She then received Xeloda for 8 cycles. Her current exam and blood work indicates no evidence of recurrence. With this said, she follows up in 6 months time with a CT scan of the chest abdomen and pelvis, bloodwork this included CEA level. Of note her last colonoscopy was performed in 2012. 2. History of IBS. Recommend followup with GI. 3. Foreign-body sensation in her throat - will assist in referral to ENT.  Spent more than half the time coordinating care.    Laurice Record., MD 7/30/201312:44 PM

## 2012-06-13 NOTE — Patient Instructions (Signed)
Albertina Leise Gripp  [id number]  [facility] Discharge Instructions  RECOMMENDATIONS MADE BY THE CONSULTANT AND ANY TEST RESULTS WILL BE SENT TO YOUR REFERRING DOCTOR.   EXAM FINDINGS BY MD TODAY AND SIGNS AND SYMPTOMS TO REPORT TO CLINIC OR PRIMARY MD:   Your current list of medications are: Current Outpatient Prescriptions  Medication Sig Dispense Refill  . citalopram (CELEXA) 10 MG tablet Take 10 mg by mouth daily.      . Estradiol (ESTROGEL) 0.75 MG/1.25 GM (0.06%) topical gel Place 1.25 g onto the skin daily.         INSTRUCTIONS GIVEN AND DISCUSSED:   SPECIAL INSTRUCTIONS/FOLLOW-UP:  See above.  I acknowledge that I have been informed and understand all the instructions given to me and received a copy. I do not have any more questions at this time, but understand that I may call the [facility] at [phone] during business hours should I have any further questions or need assistance in obtaining follow-up care.

## 2012-06-14 ENCOUNTER — Telehealth: Payer: Self-pay | Admitting: Hematology and Oncology

## 2012-06-14 NOTE — Telephone Encounter (Signed)
Talked to pt, she was seen by ENT yesterday @ [facility] and Throat

## 2012-11-04 ENCOUNTER — Telehealth: Payer: Self-pay | Admitting: Oncology

## 2012-11-04 ENCOUNTER — Telehealth: Payer: Self-pay | Admitting: *Deleted

## 2012-11-04 NOTE — Telephone Encounter (Signed)
Called  Patient and left message regarding appt rescheduling them from Dr.Odogwu to Dr.Shadad, waiting for a call back

## 2012-11-04 NOTE — Telephone Encounter (Signed)
A message was  Left on VM concerning pt's appt on 12/13/2012, pt was asked to call back at [phone] to reschedule appt.

## 2012-11-18 ENCOUNTER — Encounter: Payer: Self-pay | Admitting: *Deleted

## 2012-11-27 ENCOUNTER — Telehealth: Payer: Self-pay | Admitting: Oncology

## 2012-11-27 NOTE — Telephone Encounter (Signed)
pt wanted to change lab, ct, and est..Marland KitchenMarland KitchenDone

## 2012-12-12 ENCOUNTER — Other Ambulatory Visit: Payer: Managed Care, Other (non HMO) | Admitting: Lab

## 2012-12-12 ENCOUNTER — Ambulatory Visit (HOSPITAL_COMMUNITY): Payer: Managed Care, Other (non HMO)

## 2012-12-13 ENCOUNTER — Ambulatory Visit: Payer: Managed Care, Other (non HMO) | Admitting: Hematology and Oncology

## 2012-12-15 ENCOUNTER — Ambulatory Visit: Payer: Managed Care, Other (non HMO) | Admitting: Oncology

## 2012-12-26 ENCOUNTER — Other Ambulatory Visit: Payer: Managed Care, Other (non HMO)

## 2012-12-26 ENCOUNTER — Other Ambulatory Visit (HOSPITAL_BASED_OUTPATIENT_CLINIC_OR_DEPARTMENT_OTHER): Payer: Managed Care, Other (non HMO) | Admitting: Lab

## 2012-12-26 ENCOUNTER — Telehealth: Payer: Self-pay | Admitting: *Deleted

## 2012-12-26 ENCOUNTER — Ambulatory Visit (HOSPITAL_COMMUNITY)
Admission: RE | Admit: 2012-12-26 | Discharge: 2012-12-26 | Disposition: A | Discharge status: Home / Self Care | Payer: Managed Care, Other (non HMO) | Source: Ambulatory Visit | Attending: Hematology and Oncology | Admitting: Hematology and Oncology

## 2012-12-26 DIAGNOSIS — Z9221 Personal history of antineoplastic chemotherapy: Secondary | ICD-10-CM | POA: Insufficient documentation

## 2012-12-26 DIAGNOSIS — C20 Malignant neoplasm of rectum: Secondary | ICD-10-CM

## 2012-12-26 DIAGNOSIS — Z923 Personal history of irradiation: Secondary | ICD-10-CM | POA: Insufficient documentation

## 2012-12-26 LAB — CBC WITH DIFFERENTIAL/PLATELET
Basophils Absolute: 0 10*3/uL (ref 0.0–0.1)
Eosinophils Absolute: 0.1 10*3/uL (ref 0.0–0.5)
HCT: 38.8 % (ref 34.8–46.6)
HGB: 13.2 g/dL (ref 11.6–15.9)
MONO#: 0.4 10*3/uL (ref 0.1–0.9)
NEUT#: 2.6 10*3/uL (ref 1.5–6.5)
NEUT%: 65.1 % (ref 38.4–76.8)
RDW: 11.9 % (ref 11.2–14.5)
lymph#: 0.9 10*3/uL (ref 0.9–3.3)

## 2012-12-26 LAB — COMPREHENSIVE METABOLIC PANEL (CC13)
AST: 17 U/L (ref 5–34)
Albumin: 4.1 g/dL (ref 3.5–5.0)
BUN: 13.2 mg/dL (ref 7.0–26.0)
CO2: 29 mEq/L (ref 22–29)
Calcium: 9.6 mg/dL (ref 8.4–10.4)
Chloride: 103 mEq/L (ref 98–107)
Creatinine: 0.8 mg/dL (ref 0.6–1.1)
Glucose: 98 mg/dl (ref 70–99)
Potassium: 4.2 mEq/L (ref 3.5–5.1)

## 2012-12-26 MED ORDER — IOHEXOL 300 MG/ML  SOLN
100.0000 mL | Freq: Once | INTRAMUSCULAR | Status: AC | PRN
  Administered 2012-12-26: 100 mL via INTRAVENOUS

## 2012-12-26 MED ORDER — IOHEXOL 300 MG/ML  SOLN
50.0000 mL | Freq: Once | INTRAMUSCULAR | Status: AC | PRN
  Administered 2012-12-26: 50 mL via ORAL

## 2012-12-26 NOTE — Telephone Encounter (Signed)
Called x 2. Left message for patient to call me.

## 2012-12-27 ENCOUNTER — Encounter: Payer: Self-pay | Admitting: *Deleted

## 2012-12-27 ENCOUNTER — Ambulatory Visit: Payer: Managed Care, Other (non HMO) | Admitting: Oncology

## 2012-12-27 ENCOUNTER — Ambulatory Visit (HOSPITAL_BASED_OUTPATIENT_CLINIC_OR_DEPARTMENT_OTHER): Payer: Managed Care, Other (non HMO) | Admitting: Oncology

## 2012-12-27 ENCOUNTER — Telehealth: Payer: Self-pay | Admitting: Oncology

## 2012-12-27 VITALS — BP 112/71 | HR 83 | Temp 97.7°F | Resp 20 | Ht 66.0 in | Wt 140.5 lb

## 2012-12-27 DIAGNOSIS — C20 Malignant neoplasm of rectum: Secondary | ICD-10-CM

## 2012-12-27 LAB — CEA: CEA: 1.1 ng/mL (ref 0.0–5.0)

## 2012-12-27 NOTE — Progress Notes (Signed)
Hematology and Oncology Follow Up Visit  Denise Michael [id number] 10-16-1971 42 y.o. 12/27/2012 11:36 AM   Principle Diagnosis: 42 year old with with rectal adenocarcinoma presumed T2 or T3 disease in 2011  Prior Therapy: 1. Neoadjuvant 5-FU with radiation therapy  2. She is status post abdominoperineal resection with ileostomy at [facility] on May 25, 201/ Surgical pathology revealed a tumor measuring 0.5 cm with no lymph node involvement or lymphovascular invasion. Surgical margins were clear. She had an ileostomy reversal in 2011 as well. 3. Following surgery received adjuvant chemotherapy in the form of XELOX for 1 cycle and then was not able to tolerate oxaliplatin. She continued with Xeloda for 8 cycles.   Current therapy: Observation and follow up.   Interim History: [name] presents today for a follow up visit. She is a nice women with the above history. She continues to do well at this time. She still reports some intermittent  rectal bleeding due to hemorrhoids. Has not had no specific GI complaints besides loose frequent bowel movements are since his surgery. Is known to have a history of irritable bowel syndrome. Her weight is stable. She denies pain.   Medications: I have reviewed the patient's current medications. Current outpatient prescriptions:citalopram (CELEXA) 10 MG tablet, Take 10 mg by mouth daily., Disp: , Rfl: ;  Estradiol (ESTROGEL) 0.75 MG/1.25 GM (0.06%) topical gel, Place 1.25 g onto the skin daily., Disp: , Rfl:   Allergies: No Known Allergies  Past Medical History, Surgical history, Social history, and Family History were reviewed and updated.  Review of Systems: Constitutional:  Negative for fever, chills, night sweats, anorexia, weight loss, pain. Cardiovascular: no chest pain or dyspnea on exertion Respiratory: negative Neurological: negative Dermatological: negative ENT: negative Skin: Negative. Gastrointestinal: negative Genito-Urinary:  negative Hematological and Lymphatic: negative Breast: negative Musculoskeletal: negative Remaining ROS negative. Physical Exam: Blood pressure 112/71, pulse 83, temperature 97.7 F (36.5 C), temperature source Oral, resp. rate 20, height 5\' 6"  (1.676 m), weight 140 lb 8 oz (63.73 kg). ECOG:  General appearance: alert Head: Normocephalic, without obvious abnormality, atraumatic Neck: no adenopathy, no carotid bruit, no JVD, supple, symmetrical, trachea midline and thyroid not enlarged, symmetric, no tenderness/mass/nodules Lymph nodes: Cervical, supraclavicular, and axillary nodes normal. Heart:regular rate and rhythm, S1, S2 normal, no murmur, click, rub or gallop Lung:chest clear, no wheezing, rales, normal symmetric air entry Abdomin: soft, non-tender, without masses or organomegaly EXT:no erythema, induration, or nodules   Lab Results: Lab Results  Component Value Date   WBC 4.0 12/26/2012   HGB 13.2 12/26/2012   HCT 38.8 12/26/2012   MCV 91.5 12/26/2012   PLT 196 12/26/2012     Chemistry      Component Value Date/Time   NA 139 12/26/2012 0805   NA 140 06/12/2012 0821   NA 142 12/10/2011 1234   K 4.2 12/26/2012 0805   K 4.5 06/12/2012 0821   K 3.5 12/10/2011 1234   CL 103 12/26/2012 0805   CL 107 06/12/2012 0821   CL 100 12/10/2011 1234   CO2 29 12/26/2012 0805   CO2 28 06/12/2012 0821   CO2 29 12/10/2011 1234   BUN 13.2 12/26/2012 0805   BUN 18 06/12/2012 0821   BUN 15 12/10/2011 1234   CREATININE 0.8 12/26/2012 0805   CREATININE 0.73 06/12/2012 0821   CREATININE 0.5* 12/10/2011 1234      Component Value Date/Time   CALCIUM 9.6 12/26/2012 0805   CALCIUM 9.4 06/12/2012 0821   CALCIUM 9.3 12/10/2011  1234   ALKPHOS 73 12/26/2012 0805   ALKPHOS 65 06/12/2012 0821   ALKPHOS 62 12/10/2011 1234   AST 17 12/26/2012 0805   AST 17 06/12/2012 0821   AST 15 12/10/2011 1234   ALT 12 12/26/2012 0805   ALT 15 06/12/2012 0821   BILITOT 0.34 12/26/2012 0805   BILITOT 0.4 06/12/2012 0821   BILITOT  0.60 12/10/2011 1234       Radiological Studies: Ct Chest W Contrast  12/26/2012  *RADIOLOGY REPORT*  Clinical Data:  Rectal adenocarcinoma diagnosed in 2011.  The patient has received  neoadjuvant chemotherapy, surgical therapy,, radiation therapy and chemotherapy.  CT CHEST, ABDOMEN AND PELVIS WITH CONTRAST  Technique:  Multidetector CT imaging of the chest, abdomen and pelvis was performed following the standard protocol during bolus administration of intravenous contrast.  Contrast: 50mL OMNIPAQUE IOHEXOL 300 MG/ML  SOLN, OMNIPAQUE IOHEXOL 300 MG/ML  SOLN  Comparison:  CT scan 12/10/2011, PET CT scan 12/12/2009  CT CHEST  Findings:  No axillary or supraclavicular lymphadenopathy.  No mediastinal or hilar lymphadenopathy.  No pericardial fluid. Esophagus is normal.  Review of the lung parenchyma demonstrates no suspicious pulmonary nodules.  Tiny nodule along the right oblique fissure measuring 3 mm (image 34) is unchanged from prior CT of 12/02/2011.  IMPRESSION:  1.  No evidence of thoracic metastasis. 2.  Stable small nodule along the right oblique fissure.  CT ABDOMEN AND PELVIS  Findings:  No focal hepatic lesion.  There is a focus of fatty infiltration along the falciform ligament.  Gallbladder, pancreas, spleen, adrenal glands, and kidneys are normal.  The stomach, small bowel, appendix, and cecum are normal.  There is a moderate volume stool throughout the colon.  Low rectal anastomosis (image 124), with no evidence of nodularity or obstruction at the anastomoses.  Abdominal aorta is normal caliber.  No retroperitoneal or periportal lymphadenopathy.  No free fluid in the pelvis.  The bladder and uterus are normal. No pelvic lymphadenopathy.  Review of bone windows demonstrates lucent lesions within the L4 and L1 vertebral bodies which are unchanged from prior and likely benign.  IMPRESSION:  1.  Stable exam of the abdomen and pelvis.  2.  Stable low rectal anastomoses without local recurrence.  3.  No evidence of metastasis in the abdomen pelvis.   Original Report Authenticated By: Genevive Bi, M.D.    Ct Abdomen Pelvis W Contrast  12/26/2012  *RADIOLOGY REPORT*  Clinical Data:  Rectal adenocarcinoma diagnosed in 2011.  The patient has received  neoadjuvant chemotherapy, surgical therapy,, radiation therapy and chemotherapy.  CT CHEST, ABDOMEN AND PELVIS WITH CONTRAST  Technique:  Multidetector CT imaging of the chest, abdomen and pelvis was performed following the standard protocol during bolus administration of intravenous contrast.  Contrast: 50mL OMNIPAQUE IOHEXOL 300 MG/ML  SOLN, OMNIPAQUE IOHEXOL 300 MG/ML  SOLN  Comparison:  CT scan 12/10/2011, PET CT scan 12/12/2009  CT CHEST  Findings:  No axillary or supraclavicular lymphadenopathy.  No mediastinal or hilar lymphadenopathy.  No pericardial fluid. Esophagus is normal.  Review of the lung parenchyma demonstrates no suspicious pulmonary nodules.  Tiny nodule along the right oblique fissure measuring 3 mm (image 34) is unchanged from prior CT of 12/02/2011.  IMPRESSION:  1.  No evidence of thoracic metastasis. 2.  Stable small nodule along the right oblique fissure.  CT ABDOMEN AND PELVIS  Findings:  No focal hepatic lesion.  There is a focus of fatty infiltration along the falciform ligament.  Gallbladder,  pancreas, spleen, adrenal glands, and kidneys are normal.  The stomach, small bowel, appendix, and cecum are normal.  There is a moderate volume stool throughout the colon.  Low rectal anastomosis (image 124), with no evidence of nodularity or obstruction at the anastomoses.  Abdominal aorta is normal caliber.  No retroperitoneal or periportal lymphadenopathy.  No free fluid in the pelvis.  The bladder and uterus are normal. No pelvic lymphadenopathy.  Review of bone windows demonstrates lucent lesions within the L4 and L1 vertebral bodies which are unchanged from prior and likely benign.  IMPRESSION:  1.  Stable exam of the abdomen  and pelvis.  2.  Stable low rectal anastomoses without local recurrence. 3.  No evidence of metastasis in the abdomen pelvis.   Original Report Authenticated By: Genevive Bi, M.D.      Impression and Plan: 42 year old women with:  1. Rectal cancer: She is status post abdominal perineal resection with ileostomy at [facility] on 04/08/2003 rectal adenocarcinoma. Surgical pathology revealed a tumor measuring 0.5 cm with no lymph node involvement or lymphovascular invasion. Surgical margins were clear. She received neoadjuvant 5-FU and radiation therapy and following surgery received adjuvant chemotherapy in the form of XELOX for 1 cycle [unable to tolerate oxaliplatin]. She then received Xeloda for 8 cycles.  Her current exam and blood work and CT scan that was reviewed today shows no evidence of recurrence. She follows up in 6 months time with bloodwork this included CEA level. We will repeat CT scan in 12 months.  Next colonoscopy due in the summer of 2014.   2. History of IBS. Stable.    [facility], MD 2/12/201411:36 AM

## 2012-12-27 NOTE — Telephone Encounter (Signed)
gv and printed appt schedule for Aug

## 2012-12-27 NOTE — Progress Notes (Unsigned)
Mailed patient's AVS and updated medication list to her home.

## 2013-04-27 ENCOUNTER — Other Ambulatory Visit: Payer: Self-pay | Admitting: Dermatology

## 2013-06-26 ENCOUNTER — Ambulatory Visit: Payer: Managed Care, Other (non HMO) | Admitting: Oncology

## 2013-06-26 ENCOUNTER — Other Ambulatory Visit: Payer: Managed Care, Other (non HMO) | Admitting: Lab

## 2013-07-17 ENCOUNTER — Other Ambulatory Visit: Payer: Self-pay

## 2013-07-17 DIAGNOSIS — Z1231 Encounter for screening mammogram for malignant neoplasm of breast: Secondary | ICD-10-CM

## 2013-07-20 ENCOUNTER — Telehealth: Payer: Self-pay | Admitting: Oncology

## 2013-07-20 NOTE — Telephone Encounter (Signed)
Returned pt's call re r/s August appointments to November. lmonvm for pt re lb/FS 11/25 @ 3:30pm. Schedule mailed.

## 2013-07-23 ENCOUNTER — Telehealth: Payer: Self-pay | Admitting: Oncology

## 2013-07-23 NOTE — Telephone Encounter (Signed)
talked to pt she is aware of appt on 11/25 witrh Dr. Clelia Croft with labs

## 2013-08-21 ENCOUNTER — Ambulatory Visit
Admission: RE | Admit: 2013-08-21 | Discharge: 2013-08-21 | Disposition: A | Discharge status: Home / Self Care | Payer: 59 | Source: Ambulatory Visit

## 2013-08-21 DIAGNOSIS — Z1231 Encounter for screening mammogram for malignant neoplasm of breast: Secondary | ICD-10-CM

## 2013-10-09 ENCOUNTER — Other Ambulatory Visit (HOSPITAL_BASED_OUTPATIENT_CLINIC_OR_DEPARTMENT_OTHER): Payer: 59 | Admitting: Lab

## 2013-10-09 ENCOUNTER — Ambulatory Visit (HOSPITAL_BASED_OUTPATIENT_CLINIC_OR_DEPARTMENT_OTHER): Payer: 59 | Admitting: Oncology

## 2013-10-09 ENCOUNTER — Telehealth: Payer: Self-pay | Admitting: Oncology

## 2013-10-09 VITALS — BP 114/80 | HR 90 | Temp 98.4°F | Resp 20 | Ht 66.0 in | Wt 142.9 lb

## 2013-10-09 DIAGNOSIS — C20 Malignant neoplasm of rectum: Secondary | ICD-10-CM

## 2013-10-09 DIAGNOSIS — Z85048 Personal history of other malignant neoplasm of rectum, rectosigmoid junction, and anus: Secondary | ICD-10-CM

## 2013-10-09 LAB — COMPREHENSIVE METABOLIC PANEL (CC13)
Alkaline Phosphatase: 70 U/L (ref 40–150)
Anion Gap: 11 mEq/L (ref 3–11)
Creatinine: 0.8 mg/dL (ref 0.6–1.1)
Glucose: 91 mg/dl (ref 70–140)
Sodium: 141 mEq/L (ref 136–145)
Total Bilirubin: 0.54 mg/dL (ref 0.20–1.20)
Total Protein: 7.1 g/dL (ref 6.4–8.3)

## 2013-10-09 LAB — CBC WITH DIFFERENTIAL/PLATELET
BASO%: 0.7 % (ref 0.0–2.0)
Eosinophils Absolute: 0.1 10*3/uL (ref 0.0–0.5)
HGB: 13.5 g/dL (ref 11.6–15.9)
LYMPH%: 20.6 % (ref 14.0–49.7)
MCH: 31.3 pg (ref 25.1–34.0)
MONO#: 0.4 10*3/uL (ref 0.1–0.9)
MONO%: 8.2 % (ref 0.0–14.0)
NEUT#: 3.2 10*3/uL (ref 1.5–6.5)
NEUT%: 68 % (ref 38.4–76.8)
RDW: 12 % (ref 11.2–14.5)
WBC: 4.7 10*3/uL (ref 3.9–10.3)
lymph#: 1 10*3/uL (ref 0.9–3.3)

## 2013-10-09 NOTE — Progress Notes (Signed)
Hematology and Oncology Follow Up Visit  Denise Michael [id number] March 03, 1971 41 y.o. 10/09/2013 4:04 PM   Principle Diagnosis: 42 year old with with rectal adenocarcinoma presumed T2 or T3 disease in 2011  Prior Therapy: 1. Neoadjuvant 5-FU with radiation therapy  2. She is status post abdominoperineal resection with ileostomy at [facility] on May 25, 201/ Surgical pathology revealed a tumor measuring 0.5 cm with no lymph node involvement or lymphovascular invasion. Surgical margins were clear. She had an ileostomy reversal in 2011 as well. 3. Following surgery received adjuvant chemotherapy in the form of XELOX for 1 cycle and then was not able to tolerate oxaliplatin. She continued with Xeloda for 8 cycles.   Current therapy: Observation and follow up.   Interim History: Denise Michael presents today for a follow up visit. She is a nice women with the above history. She continues to do well at this time. She reports no bleeding as of late. Has not had no specific GI complaints besides loose frequent bowel movements are since his surgery. Is known to have a history of irritable bowel syndrome. Her weight is stable. She did have a colonoscopy without any complications or any new findings.   Medications: I have reviewed the patient's current medications. Current outpatient prescriptions:citalopram (CELEXA) 10 MG tablet, Take 20 mg by mouth daily. , Disp: , Rfl:   Allergies: No Known Allergies  Past Medical History, Surgical history, Social history, and Family History were reviewed and updated.  Review of Systems:  Remaining ROS negative. Physical Exam: Blood pressure 114/80, pulse 90, temperature 98.4 F (36.9 C), temperature source Oral, resp. rate 20, height 5\' 6"  (1.676 m), weight 142 lb 14.4 oz (64.819 kg), SpO2 99.00%. ECOG: 0 General appearance: alert Head: Normocephalic, without obvious abnormality, atraumatic Neck: no adenopathy, no carotid bruit, no JVD, supple,  symmetrical, trachea midline and thyroid not enlarged, symmetric, no tenderness/mass/nodules Lymph nodes: Cervical, supraclavicular, and axillary nodes normal. Heart:regular rate and rhythm, S1, S2 normal, no murmur, click, rub or gallop Lung:chest clear, no wheezing, rales, normal symmetric air entry Abdomin: soft, non-tender, without masses or organomegaly EXT:no erythema, induration, or nodules   Lab Results: Lab Results  Component Value Date   WBC 4.7 10/09/2013   HGB 13.5 10/09/2013   HCT 39.8 10/09/2013   MCV 91.9 10/09/2013   PLT 210 10/09/2013     Chemistry      Component Value Date/Time   NA 139 12/26/2012 0805   NA 140 06/12/2012 0821   NA 142 12/10/2011 1234   K 4.2 12/26/2012 0805   K 4.5 06/12/2012 0821   K 3.5 12/10/2011 1234   CL 103 12/26/2012 0805   CL 107 06/12/2012 0821   CL 100 12/10/2011 1234   CO2 29 12/26/2012 0805   CO2 28 06/12/2012 0821   CO2 29 12/10/2011 1234   BUN 13.2 12/26/2012 0805   BUN 18 06/12/2012 0821   BUN 15 12/10/2011 1234   CREATININE 0.8 12/26/2012 0805   CREATININE 0.73 06/12/2012 0821   CREATININE 0.5* 12/10/2011 1234      Component Value Date/Time   CALCIUM 9.6 12/26/2012 0805   CALCIUM 9.4 06/12/2012 0821   CALCIUM 9.3 12/10/2011 1234   ALKPHOS 73 12/26/2012 0805   ALKPHOS 65 06/12/2012 0821   ALKPHOS 62 12/10/2011 1234   AST 17 12/26/2012 0805   AST 17 06/12/2012 0821   AST 15 12/10/2011 1234   ALT 12 12/26/2012 0805   ALT 15 06/12/2012 0821   ALT 17  12/10/2011 1234   BILITOT 0.34 12/26/2012 0805   BILITOT 0.4 06/12/2012 0821   BILITOT 0.60 12/10/2011 1234       Impression and Plan: 42 year old women with:  1. Rectal cancer: She is status post abdominal perineal resection with ileostomy at [facility] on 04/07/2010 rectal adenocarcinoma. Surgical pathology revealed a tumor measuring 0.5 cm with no lymph node involvement or lymphovascular invasion. Surgical margins were clear. She received neoadjuvant 5-FU and radiation therapy and following surgery  received adjuvant chemotherapy in the form of XELOX for 1 cycle [unable to tolerate oxaliplatin]. She then received Xeloda for 8 cycles.   Her last CT scan done on February of 2014 did not show any relapsed disease. She also had a colonoscopy this year that was unremarkable. The plan is to repeat her CT scan in 2015 and do it on an annual basis till 2017.  2. History of IBS. Stable.    [facility], MD 11/25/20144:04 PM

## 2013-10-09 NOTE — Telephone Encounter (Signed)
GV AND PRINTED APPT SCHED AND AVS FOR PT FOR aPRIL 2015   PT WANTS WATER BASED

## 2013-10-10 LAB — CEA: CEA: 1.2 ng/mL (ref 0.0–5.0)

## 2014-02-10 ENCOUNTER — Encounter (HOSPITAL_COMMUNITY): Payer: Self-pay | Admitting: Emergency Medicine

## 2014-02-10 ENCOUNTER — Emergency Department (HOSPITAL_COMMUNITY)
Admission: EM | Admit: 2014-02-10 | Discharge: 2014-02-10 | Disposition: A | Discharge status: Home / Self Care | Payer: 59 | Source: Home / Self Care

## 2014-02-10 DIAGNOSIS — J029 Acute pharyngitis, unspecified: Secondary | ICD-10-CM

## 2014-02-10 DIAGNOSIS — R0982 Postnasal drip: Secondary | ICD-10-CM

## 2014-02-10 DIAGNOSIS — J309 Allergic rhinitis, unspecified: Secondary | ICD-10-CM

## 2014-02-10 LAB — POCT RAPID STREP A: Streptococcus, Group A Screen (Direct): NEGATIVE

## 2014-02-10 NOTE — ED Notes (Signed)
C/o sore throat onset 2 days ago but worse today.  No chills or fever.  No runny nose, cough or earache. Has been a little stuffy in the morning.

## 2014-02-10 NOTE — Discharge Instructions (Signed)
Allergic Rhinitis °Allergic rhinitis is when the mucous membranes in the nose respond to allergens. Allergens are particles in the air that cause your body to have an allergic reaction. This causes you to release allergic antibodies. Through a chain of events, these eventually cause you to release histamine into the blood stream. Although meant to protect the body, it is this release of histamine that causes your discomfort, such as frequent sneezing, congestion, and an itchy, runny nose.  °CAUSES  °Seasonal allergic rhinitis (hay fever) is caused by pollen allergens that may come from grasses, trees, and weeds. Year-round allergic rhinitis (perennial allergic rhinitis) is caused by allergens such as house dust mites, pet dander, and mold spores.  °SYMPTOMS  °· Nasal stuffiness (congestion). °· Itchy, runny nose with sneezing and tearing of the eyes. °DIAGNOSIS  °Your health care provider can help you determine the allergen or allergens that trigger your symptoms. If you and your health care provider are unable to determine the allergen, skin or blood testing may be used. °TREATMENT  °Allergic Rhinitis does not have a cure, but it can be controlled by: °· Medicines and allergy shots (immunotherapy). °· Avoiding the allergen. °Hay fever may often be treated with antihistamines in pill or nasal spray forms. Antihistamines block the effects of histamine. There are over-the-counter medicines that may help with nasal congestion and swelling around the eyes. Check with your health care provider before taking or giving this medicine.  °If avoiding the allergen or the medicine prescribed do not work, there are many new medicines your health care provider can prescribe. Stronger medicine may be used if initial measures are ineffective. Desensitizing injections can be used if medicine and avoidance does not work. Desensitization is when a patient is given ongoing shots until the body becomes less sensitive to the allergen.  Make sure you follow up with your health care provider if problems continue. °HOME CARE INSTRUCTIONS °It is not possible to completely avoid allergens, but you can reduce your symptoms by taking steps to limit your exposure to them. It helps to know exactly what you are allergic to so that you can avoid your specific triggers. °SEEK MEDICAL CARE IF:  °· You have a fever. °· You develop a cough that does not stop easily (persistent). °· You have shortness of breath. °· You start wheezing. °· Symptoms interfere with normal daily activities. °Document Released: 07/27/2001 Document Revised: 08/22/2013 Document Reviewed: 07/09/2013 °[employer]® Patient Information ©2014 [employer]. ° °Allergies °Allergies may happen from anything your body is sensitive to. This may be food, medicines, pollens, chemicals, and nearly anything around you in everyday life that produces allergens. An allergen is anything that causes an allergy producing substance. Heredity is often a factor in causing these problems. This means you may have some of the same allergies as your parents. °Food allergies happen in all age groups. Food allergies are some of the most severe and life threatening. Some common food allergies are cow's milk, seafood, eggs, nuts, wheat, and soybeans. °SYMPTOMS  °· Swelling around the mouth. °· An itchy red rash or hives. °· Vomiting or diarrhea. °· Difficulty breathing. °SEVERE ALLERGIC REACTIONS ARE LIFE-THREATENING. °This reaction is called anaphylaxis. It can cause the mouth and throat to swell and cause difficulty with breathing and swallowing. In severe reactions only a trace amount of food (for example, peanut oil in a salad) may cause death within seconds. °Seasonal allergies occur in all age groups. These are seasonal because they usually occur during the same   season every year. They may be a reaction to molds, grass pollens, or tree pollens. Other causes of problems are house dust mite allergens, pet dander,  and mold spores. The symptoms often consist of nasal congestion, a runny itchy nose associated with sneezing, and tearing itchy eyes. There is often an associated itching of the mouth and ears. The problems happen when you come in contact with pollens and other allergens. Allergens are the particles in the air that the body reacts to with an allergic reaction. This causes you to release allergic antibodies. Through a chain of events, these eventually cause you to release histamine into the blood stream. Although it is meant to be protective to the body, it is this release that causes your discomfort. This is why you were given anti-histamines to feel better.  If you are unable to pinpoint the offending allergen, it may be determined by skin or blood testing. Allergies cannot be cured but can be controlled with medicine. °Hay fever is a collection of all or some of the seasonal allergy problems. It may often be treated with simple over-the-counter medicine such as diphenhydramine. Take medicine as directed. Do not drink alcohol or drive while taking this medicine. Check with your caregiver or package insert for child dosages. °If these medicines are not effective, there are many new medicines your caregiver can prescribe. Stronger medicine such as nasal spray, eye drops, and corticosteroids may be used if the first things you try do not work well. Other treatments such as immunotherapy or desensitizing injections can be used if all else fails. Follow up with your caregiver if problems continue. These seasonal allergies are usually not life threatening. They are generally more of a nuisance that can often be handled using medicine. °HOME CARE INSTRUCTIONS  °· If unsure what causes a reaction, keep a diary of foods eaten and symptoms that follow. Avoid foods that cause reactions. °· If hives or rash are present: °· Take medicine as directed. °· You may use an over-the-counter antihistamine (diphenhydramine) for hives  and itching as needed. °· Apply cold compresses (cloths) to the skin or take baths in cool water. Avoid hot baths or showers. Heat will make a rash and itching worse. °· If you are severely allergic: °· Following a treatment for a severe reaction, hospitalization is often required for closer follow-up. °· Wear a medic-alert bracelet or necklace stating the allergy. °· You and your family must learn how to give adrenaline or use an anaphylaxis kit. °· If you have had a severe reaction, always carry your anaphylaxis kit or EpiPen® with you. Use this medicine as directed by your caregiver if a severe reaction is occurring. Failure to do so could have a fatal outcome. °SEEK MEDICAL CARE IF: °· You suspect a food allergy. Symptoms generally happen within 30 minutes of eating a food. °· Your symptoms have not gone away within 2 days or are getting worse. °· You develop new symptoms. °· You want to retest yourself or your child with a food or drink you think causes an allergic reaction. Never do this if an anaphylactic reaction to that food or drink has happened before. Only do this under the care of a caregiver. °SEEK IMMEDIATE MEDICAL CARE IF:  °· You have difficulty breathing, are wheezing, or have a tight feeling in your chest or throat. °· You have a swollen mouth, or you have hives, swelling, or itching all over your body. °· You have had a severe reaction that has responded   to your anaphylaxis kit or an EpiPen. These reactions may return when the medicine has worn off. These reactions should be considered life threatening. MAKE SURE YOU:   Understand these instructions.  Will watch your condition.  Will get help right away if you are not doing well or get worse. Document Released: 01/25/2003 Document Revised: 02/26/2013 Document Reviewed: 07/01/2008 [facility] Patient Information 2014 [place].  Pharyngitis Pharyngitis is a sore throat (pharynx). There is redness, pain, and swelling of your  throat. HOME CARE   Drink enough fluids to keep your pee (urine) clear or pale yellow.  Only take medicine as told by your doctor.  You may get sick again if you do not take medicine as told. Finish your medicines, even if you start to feel better.  Do not take aspirin.  Rest.  Rinse your mouth (gargle) with salt water ( tsp of salt per 1 qt of water) every 1 2 hours. This will help the pain.  If you are not at risk for choking, you can suck on hard candy or sore throat lozenges. GET HELP IF:  You have large, tender lumps on your neck.  You have a rash.  You cough up green, yellow-brown, or bloody spit. GET HELP RIGHT AWAY IF:   You have a stiff neck.  You drool or cannot swallow liquids.  You throw up (vomit) or are not able to keep medicine or liquids down.  You have very bad pain that does not go away with medicine.  You have problems breathing (not from a stuffy nose). MAKE SURE YOU:   Understand these instructions.  Will watch your condition.  Will get help right away if you are not doing well or get worse. Document Released: 04/19/2008 Document Revised: 08/22/2013 Document Reviewed: 07/09/2013 [facility] Patient Information 2014 [place].

## 2014-02-10 NOTE — ED Provider Notes (Signed)
CSN: [id number]     Arrival date & time 02/10/14  1813 History   None    Chief Complaint  Patient presents with  . Sore Throat   (Consider location/radiation/quality/duration/timing/severity/associated sxs/prior Treatment) HPI Comments: 43 year old female complaining of sore throat for everyday. She also noticed a small bump in the left side of the throat. She wants to be assured that she does not have anything contagious. She also has PND, clearing her throat frequently. Denies fever or other associated symptoms.   Past Medical History  Diagnosis Date  . Rectal cancer     rectal ca dx 11/2009  . Rectal cancer    Past Surgical History  Procedure Laterality Date  . Rectal surgery  03/2010    TO REMOVE CANCEROUS TUMOR IN RECTUM AND ESTAB TEMPORARY ILIEOSTOMY  . Reverse ilieostomy  10/2010   Family History  Problem Relation Age of Onset  . Graves' disease Mother   . Hypertension Father   . Heart disease Maternal Grandmother   . Cancer Maternal Grandfather     BLADDER/PROSTATE  . Breast cancer Paternal Grandmother    History  Substance Use Topics  . Smoking status: Current Some Day Smoker -- 0.50 packs/day    Types: Cigarettes  . Smokeless tobacco: Never Used  . Alcohol Use: No   OB History   Grav Para Term Preterm Abortions TAB SAB Ect Mult Living                 Review of Systems  Constitutional: Negative.   HENT: Positive for congestion, postnasal drip and sore throat.   Respiratory: Negative for cough, shortness of breath and wheezing.   Cardiovascular: Negative.   Gastrointestinal: Negative.   Neurological: Negative.     Allergies  Review of patient's allergies indicates no known allergies.  Home Medications   Current Outpatient Rx  Name  Route  Sig  Dispense  Refill  . citalopram (CELEXA) 10 MG tablet   Oral   Take 20 mg by mouth daily.           BP 118/78  Pulse 80  Temp(Src) 98.5 F (36.9 C) (Oral)  Resp 16  SpO2 99% Physical Exam  Nursing  note and vitals reviewed. Constitutional: She is oriented to person, place, and time. She appears well-developed and well-nourished. No distress.  HENT:  Mouth/Throat: No oropharyngeal exudate.  Bilateral TM's nl OP with mild erythema, cobblestoning and clear PND The lesion on the left arc is not an exudate but rather a 2 mm pale papule often seen on nl OP exams.  Eyes: Conjunctivae and EOM are normal.  Neck: Normal range of motion. Neck supple.  Cardiovascular: Normal rate and regular rhythm.   Pulmonary/Chest: Effort normal and breath sounds normal. No respiratory distress.  Lymphadenopathy:    She has cervical adenopathy.  Neurological: She is alert and oriented to person, place, and time. She exhibits normal muscle tone.  Skin: Skin is warm and dry. No rash noted.  Psychiatric: She has a normal mood and affect.    ED Course  Procedures (including critical care time) Labs Review Labs Reviewed  POCT RAPID STREP A ([facility] CARE ONLY)   Imaging Review No results found. Results for orders placed during the hospital encounter of 02/10/14  POCT RAPID STREP A ([facility] CARE ONLY)      Result Value Ref Range   Streptococcus, Group A Screen (Direct) NEGATIVE  NEGATIVE     MDM   1. Allergic rhinitis  2. PND (post-nasal drip)   3. Allergic pharyngitis      Allegra 180 mg q d flonase ns reassurance    Janne Napoleon, NP 02/10/14 [phone]

## 2014-02-12 LAB — CULTURE, GROUP A STREP

## 2014-02-13 NOTE — ED Provider Notes (Signed)
Medical screening examination/treatment/procedure(s) were performed by a resident physician or non-physician practitioner and as the supervising physician I was immediately available for consultation/collaboration.  [name], MD    [name], MD 02/13/14 0750 

## 2014-03-05 ENCOUNTER — Ambulatory Visit (HOSPITAL_COMMUNITY)
Admission: RE | Admit: 2014-03-05 | Discharge: 2014-03-05 | Disposition: A | Discharge status: Home / Self Care | Payer: 59 | Source: Ambulatory Visit | Attending: Oncology | Admitting: Oncology

## 2014-03-05 ENCOUNTER — Other Ambulatory Visit (HOSPITAL_BASED_OUTPATIENT_CLINIC_OR_DEPARTMENT_OTHER): Payer: 59

## 2014-03-05 ENCOUNTER — Encounter (HOSPITAL_COMMUNITY): Payer: Self-pay

## 2014-03-05 DIAGNOSIS — C20 Malignant neoplasm of rectum: Secondary | ICD-10-CM

## 2014-03-05 DIAGNOSIS — Z85048 Personal history of other malignant neoplasm of rectum, rectosigmoid junction, and anus: Secondary | ICD-10-CM

## 2014-03-05 DIAGNOSIS — Z9221 Personal history of antineoplastic chemotherapy: Secondary | ICD-10-CM | POA: Insufficient documentation

## 2014-03-05 DIAGNOSIS — Z923 Personal history of irradiation: Secondary | ICD-10-CM | POA: Insufficient documentation

## 2014-03-05 DIAGNOSIS — K7689 Other specified diseases of liver: Secondary | ICD-10-CM | POA: Insufficient documentation

## 2014-03-05 LAB — CBC WITH DIFFERENTIAL/PLATELET
BASO%: 1 % (ref 0.0–2.0)
BASOS ABS: 0 10*3/uL (ref 0.0–0.1)
EOS%: 5.9 % (ref 0.0–7.0)
Eosinophils Absolute: 0.2 10*3/uL (ref 0.0–0.5)
HEMATOCRIT: 41.5 % (ref 34.8–46.6)
HEMOGLOBIN: 14.1 g/dL (ref 11.6–15.9)
LYMPH%: 31.1 % (ref 14.0–49.7)
MCH: 31.5 pg (ref 25.1–34.0)
MCHC: 34 g/dL (ref 31.5–36.0)
MCV: 92.5 fL (ref 79.5–101.0)
MONO#: 0.3 10*3/uL (ref 0.1–0.9)
MONO%: 9 % (ref 0.0–14.0)
NEUT#: 1.7 10*3/uL (ref 1.5–6.5)
NEUT%: 53 % (ref 38.4–76.8)
Platelets: 195 10*3/uL (ref 145–400)
RBC: 4.48 10*6/uL (ref 3.70–5.45)
RDW: 12.5 % (ref 11.2–14.5)
WBC: 3.2 10*3/uL — AB (ref 3.9–10.3)
lymph#: 1 10*3/uL (ref 0.9–3.3)

## 2014-03-05 LAB — COMPREHENSIVE METABOLIC PANEL (CC13)
ALT: 13 U/L (ref 0–55)
ANION GAP: 10 meq/L (ref 3–11)
AST: 19 U/L (ref 5–34)
Albumin: 4.5 g/dL (ref 3.5–5.0)
Alkaline Phosphatase: 74 U/L (ref 40–150)
BILIRUBIN TOTAL: 0.44 mg/dL (ref 0.20–1.20)
BUN: 15.5 mg/dL (ref 7.0–26.0)
CALCIUM: 10.2 mg/dL (ref 8.4–10.4)
CHLORIDE: 106 meq/L (ref 98–109)
CO2: 27 meq/L (ref 22–29)
CREATININE: 0.8 mg/dL (ref 0.6–1.1)
Glucose: 97 mg/dl (ref 70–140)
Potassium: 4.3 mEq/L (ref 3.5–5.1)
Sodium: 143 mEq/L (ref 136–145)
Total Protein: 7.1 g/dL (ref 6.4–8.3)

## 2014-03-05 LAB — CEA: CEA: 1.3 ng/mL (ref 0.0–5.0)

## 2014-03-05 MED ORDER — IOHEXOL 300 MG/ML  SOLN
50.0000 mL | Freq: Once | INTRAMUSCULAR | Status: AC | PRN
  Administered 2014-03-05: 50 mL via ORAL

## 2014-03-05 MED ORDER — IOHEXOL 300 MG/ML  SOLN
100.0000 mL | Freq: Once | INTRAMUSCULAR | Status: AC | PRN
  Administered 2014-03-05: 100 mL via INTRAVENOUS

## 2014-03-07 ENCOUNTER — Telehealth: Payer: Self-pay | Admitting: Oncology

## 2014-03-07 ENCOUNTER — Encounter: Payer: Self-pay | Admitting: Oncology

## 2014-03-07 ENCOUNTER — Ambulatory Visit (HOSPITAL_BASED_OUTPATIENT_CLINIC_OR_DEPARTMENT_OTHER): Payer: 59 | Admitting: Oncology

## 2014-03-07 VITALS — BP 102/65 | HR 80 | Temp 97.7°F | Resp 18 | Ht 66.0 in | Wt 150.8 lb

## 2014-03-07 DIAGNOSIS — C20 Malignant neoplasm of rectum: Secondary | ICD-10-CM

## 2014-03-07 NOTE — Progress Notes (Signed)
Hematology and Oncology Follow Up Visit  Denise Michael [id number] [birth year] 43 y.o. 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 Neoadjuvant 5-FU with radiation therapy  2. She is status post abdominoperineal resection with ileostomy at [facility] on Apr 08, 2010. Surgical pathology revealed a tumor measuring 0.5 cm with no lymph node involvement or lymphovascular invasion. Surgical margins were clear. She had an ileostomy reversal in 2011 as well. 3. Following surgery received adjuvant chemotherapy in the form of XELOX for 1 cycle and then was not able to tolerate oxaliplatin. She continued with Xeloda for 8 cycles.   Current therapy: Observation and follow up.   Interim History: [name] presents today for a follow up visit. She is a nice women with the above history. She continues to do well at this time. She reports no bleeding as of late. Has not had no specific GI complaints besides loose frequent bowel movements are since his surgery. She is known to have a history of irritable bowel syndrome. Her weight is stable. She did have a colonoscopy without any complications or any new findings. She continues to perform activities of daily living without any hindrance or decline. She does not report any headaches blurred vision or double vision. Has not reported any changes in her mental status. She is not reporting any chest pain or difficulty breathing. Has not reported any nausea vomiting or hematochezia. She does not report any frequency urgency or hesitancy. She has not reported any musculoskeletal complaints. She denied any menstrual bleeding at this time and she has had a period since 2011 after her cancer diagnosis.   Medications: I have reviewed the patient's current medications. Current outpatient prescriptions:citalopram (CELEXA) 10 MG tablet, Take 20 mg by mouth daily. , Disp: , Rfl:    Allergies: No Known Allergies  Past Medical History, Surgical history, Social history, and Family History were reviewed and updated.  Review of Systems:  Remaining ROS negative. Physical Exam: Blood pressure 102/65, pulse 80, temperature 97.7 F (36.5 C), temperature source Oral, resp. rate 18, height 5\' 6"  (1.676 m), weight 150 lb 12.8 oz (68.402 kg). ECOG: 0 General appearance: alert awake appeared in no active distress. Head: Normocephalic, without obvious abnormality, atraumatic Neck: no adenopathy, no carotid bruit, no JVD, supple, symmetrical, trachea midline and thyroid not enlarged, symmetric, no tenderness/mass/nodules Lymph nodes: Cervical, supraclavicular, and axillary nodes normal. Heart:regular rate and rhythm, S1, S2 normal, no murmur, click, rub or gallop Lung:chest clear, no wheezing, rales, normal symmetric air entry Abdomin: soft, non-tender, without masses or organomegaly EXT:no erythema, induration, or nodules   Lab Results: Lab Results  Component Value Date   WBC 3.2* 03/05/2014   HGB 14.1 03/05/2014   HCT 41.5 03/05/2014   MCV 92.5 03/05/2014   PLT 195 03/05/2014     Chemistry      Component Value Date/Time   NA 143 03/05/2014 0818   NA 140 06/12/2012 0821   NA 142 12/10/2011 1234   K 4.3 03/05/2014 0818   K 4.5 06/12/2012 0821   K 3.5 12/10/2011 1234   CL 103 12/26/2012 0805   CL 107 06/12/2012 0821   CL 100 12/10/2011 1234   CO2 27 03/05/2014 0818   CO2 28 06/12/2012 0821   CO2 29 12/10/2011 1234   BUN 15.5 03/05/2014 0818   BUN 18 06/12/2012 0821   BUN 15 12/10/2011 1234   CREATININE 0.8  03/05/2014 0818   CREATININE 0.73 06/12/2012 0821   CREATININE 0.5* 12/10/2011 1234      Component Value Date/Time   CALCIUM 10.2 03/05/2014 0818   CALCIUM 9.4 06/12/2012 0821   CALCIUM 9.3 12/10/2011 1234   ALKPHOS 74 03/05/2014 0818   ALKPHOS 65 06/12/2012 0821   ALKPHOS 62 12/10/2011 1234   AST 19 03/05/2014 0818   AST 17 06/12/2012 0821   AST 15 12/10/2011 1234   ALT 13  03/05/2014 0818   ALT 15 06/12/2012 0821   ALT 17 12/10/2011 1234   BILITOT 0.44 03/05/2014 0818   BILITOT 0.4 06/12/2012 0821   BILITOT 0.60 12/10/2011 1234     EXAM:  CT CHEST, ABDOMEN, AND PELVIS WITH CONTRAST  TECHNIQUE:  Multidetector CT imaging of the chest, abdomen and pelvis was  performed following the standard protocol during bolus  administration of intravenous contrast.  CONTRAST: 18mL OMNIPAQUE IOHEXOL 300 MG/ML SOLN  COMPARISON: CT of the chest, abdomen and pelvis 12/16/2012.  FINDINGS:  CT CHEST FINDINGS  Mediastinum: Heart size is normal. There is no significant  pericardial fluid, thickening or pericardial calcification. No  pathologically enlarged mediastinal or hilar lymph nodes. Esophagus  is unremarkable in appearance.  Lungs/Pleura: No suspicious appearing pulmonary nodules or masses.  No acute consolidative airspace disease. No pleural effusions.  Musculoskeletal: There are no aggressive appearing lytic or blastic  lesions noted in the visualized portions of the skeleton.  CT ABDOMEN AND PELVIS FINDINGS  Abdomen/Pelvis: 6 mm low attenuation lesion in segment 8 of the  liver (image 59 of series 2), too small to characterize, but  unchanged compared to prior study 12/26/2012, favored to represent a  small cyst or other benign lesion. No suspicious hepatic lesions are  noted at this time. The appearance of the gallbladder, pancreas,  spleen, bilateral adrenal glands and bilateral kidneys is  unremarkable.  No significant volume of ascites. No pneumoperitoneum. No pathologic  distention of small bowel. Normal appendix. No definite  lymphadenopathy identified within the abdomen or pelvis. Uterus and  ovaries are unremarkable in appearance. Status post partial rectal  resection with primary anastomosis in the lower rectum. No abnormal  soft tissue mass in this region to suggest local recurrence of  disease. No surrounding perirectal lymphadenopathy. Urinary bladder   is normal in appearance.  Musculoskeletal: There are no aggressive appearing lytic or blastic  lesions noted in the visualized portions of the skeleton.  IMPRESSION:  1. No findings to suggest local recurrence of disease or metastatic  disease to the chest, abdomen or pelvis.  2. 6 mm low attenuation lesion in segment 8 of the liver is too  small to definitively characterize, but is similar in retrospect  compared to prior studies dating back to 03/06/2010, and is  presumably benign (likely a tiny cyst).    Impression and Plan: 43 year old women with:  1. Rectal cancer: She is status post abdominal perineal resection with ileostomy at [facility] on 04/07/2010 rectal adenocarcinoma. Surgical pathology revealed a tumor measuring 0.5 cm with no lymph node involvement or lymphovascular invasion. Surgical margins were clear. She received neoadjuvant 5-FU and radiation therapy and following surgery received adjuvant chemotherapy in the form of XELOX for 1 cycle [unable to tolerate oxaliplatin]. She then received Xeloda for 8 cycles.    CT scan done on 02/2014 was reviewed today and did not show any relapsed disease. She also had a colonoscopy in 2014 the was unremarkable. The plan is to repeat her CT scan  in 2016. I will continue to follow her with a clinical visit every 6 months as well.  2. Age appropriate cancer screening: She is up-to-date at this point from a colonoscopy as well as mammography standpoint. She is establishing care with an OB/GYN and she is contemplating having a hysterectomy and oophorectomy as well. She genetic counseling in the past and did not have any genetic mutation that predispose her to rectal cancer. He does not have any significant family history of colorectal cancer but certainly he is open to the idea of over rectum he to prevent any risk of ovarian cancer in the future. She reports that she has been menopausal since 2011 after a diagnosis of rectal cancer. We'll continue to  address this issue with her on future visits as well.   Wyatt Portela, MD 4/23/20158:50 AM

## 2014-03-07 NOTE — Telephone Encounter (Signed)
Gave pt appt for October 2015 lab and MD °

## 2014-09-06 ENCOUNTER — Ambulatory Visit (HOSPITAL_BASED_OUTPATIENT_CLINIC_OR_DEPARTMENT_OTHER): Payer: 59 | Admitting: Oncology

## 2014-09-06 ENCOUNTER — Telehealth: Payer: Self-pay | Admitting: Oncology

## 2014-09-06 ENCOUNTER — Other Ambulatory Visit (HOSPITAL_BASED_OUTPATIENT_CLINIC_OR_DEPARTMENT_OTHER): Payer: 59

## 2014-09-06 VITALS — BP 95/60 | HR 78 | Temp 97.5°F | Resp 18 | Ht 68.0 in | Wt 154.5 lb

## 2014-09-06 DIAGNOSIS — Z85048 Personal history of other malignant neoplasm of rectum, rectosigmoid junction, and anus: Secondary | ICD-10-CM

## 2014-09-06 DIAGNOSIS — C20 Malignant neoplasm of rectum: Secondary | ICD-10-CM

## 2014-09-06 LAB — COMPREHENSIVE METABOLIC PANEL (CC13)
ALT: 19 U/L (ref 0–55)
ANION GAP: 11 meq/L (ref 3–11)
AST: 18 U/L (ref 5–34)
Albumin: 4.4 g/dL (ref 3.5–5.0)
Alkaline Phosphatase: 66 U/L (ref 40–150)
BILIRUBIN TOTAL: 0.34 mg/dL (ref 0.20–1.20)
BUN: 16.3 mg/dL (ref 7.0–26.0)
CO2: 24 meq/L (ref 22–29)
CREATININE: 0.8 mg/dL (ref 0.6–1.1)
Calcium: 9.7 mg/dL (ref 8.4–10.4)
Chloride: 107 mEq/L (ref 98–109)
Glucose: 88 mg/dl (ref 70–140)
Potassium: 3.9 mEq/L (ref 3.5–5.1)
Sodium: 141 mEq/L (ref 136–145)
Total Protein: 7 g/dL (ref 6.4–8.3)

## 2014-09-06 LAB — CBC WITH DIFFERENTIAL/PLATELET
BASO%: 1.1 % (ref 0.0–2.0)
Basophils Absolute: 0 10*3/uL (ref 0.0–0.1)
EOS ABS: 0.1 10*3/uL (ref 0.0–0.5)
EOS%: 4 % (ref 0.0–7.0)
HCT: 41.4 % (ref 34.8–46.6)
HEMOGLOBIN: 13.9 g/dL (ref 11.6–15.9)
LYMPH#: 1 10*3/uL (ref 0.9–3.3)
LYMPH%: 27.9 % (ref 14.0–49.7)
MCH: 31.1 pg (ref 25.1–34.0)
MCHC: 33.5 g/dL (ref 31.5–36.0)
MCV: 92.8 fL (ref 79.5–101.0)
MONO#: 0.3 10*3/uL (ref 0.1–0.9)
MONO%: 8.7 % (ref 0.0–14.0)
NEUT#: 2.1 10*3/uL (ref 1.5–6.5)
NEUT%: 58.3 % (ref 38.4–76.8)
Platelets: 208 10*3/uL (ref 145–400)
RBC: 4.46 10*6/uL (ref 3.70–5.45)
RDW: 11.8 % (ref 11.2–14.5)
WBC: 3.6 10*3/uL — AB (ref 3.9–10.3)

## 2014-09-06 LAB — CEA: CEA: 1.3 ng/mL (ref 0.0–5.0)

## 2014-09-06 NOTE — Telephone Encounter (Signed)
gv adn printed appt sched and avs for pt for April 2016 °

## 2014-09-06 NOTE — Progress Notes (Signed)
Hematology and Oncology Follow Up Visit  Denise Michael [id number] May 29, 1971 43 y.o. 09/06/2014 8:54 AM   Principle Diagnosis: 43 year old with with rectal adenocarcinoma presumed T2 or T3 disease in 2011  Prior Therapy: 1. Neoadjuvant 5-FU with radiation therapy  2. She is status post abdominoperineal resection with ileostomy at [facility] on Apr 08, 2010. Surgical pathology revealed a tumor measuring 0.5 cm with no lymph node involvement or lymphovascular invasion. Surgical margins were clear. She had an ileostomy reversal in 2011 as well. 3. Following surgery received adjuvant chemotherapy in the form of XELOX for 1 cycle and then was not able to tolerate oxaliplatin. She continued with Xeloda for 8 cycles.   Current therapy: Observation and follow up.   Interim History: Denise Michael presents today for a follow up visit. Since the last visit, she report no new symptoms. Has not had no specific GI complaints besides loose frequent bowel movements are since his surgery. She is known to have a history of irritable bowel syndrome. Her weight is stable. She continues to perform activities of daily living without any hindrance or decline. She does not report any headaches blurred vision or double vision. Has not reported any changes in her mental status. She is not reporting any chest pain or difficulty breathing. Has not reported any nausea vomiting or hematochezia. She does not report any frequency urgency or hesitancy. She has not reported any musculoskeletal complaints. She continues to work full-time without any decline. Rest of her review of systems unremarkable.   Medications: I have reviewed the patient's current medications. Current outpatient prescriptions:citalopram (CELEXA) 10 MG tablet, Take 20 mg by mouth daily. , Disp: , Rfl:   Allergies: No Known Allergies  Past Medical History, Surgical history, Social history, and Family History were reviewed and updated.  Review of  Systems:  Remaining ROS negative. Physical Exam: Blood pressure 95/60, pulse 78, temperature 97.5 F (36.4 C), temperature source Oral, resp. rate 18, height 5\' 8"  (1.727 m), weight 154 lb 8 oz (70.081 kg), SpO2 100.00%. ECOG: 0 General appearance: alert awake appeared in no active distress. Head: Normocephalic, without obvious abnormality, atraumatic Neck: no adenopathy. Lymph nodes: Cervical, supraclavicular, and axillary nodes normal. Heart:regular rate and rhythm, S1, S2 normal, no murmur, click, rub or gallop Lung:chest clear, no wheezing, rales, normal symmetric air entry Abdomin: soft, non-tender, without masses or organomegaly EXT:no erythema, induration, or nodules   Lab Results: Lab Results  Component Value Date   WBC 3.6* 09/06/2014   HGB 13.9 09/06/2014   HCT 41.4 09/06/2014   MCV 92.8 09/06/2014   PLT 208 09/06/2014     Chemistry      Component Value Date/Time   NA 143 03/05/2014 0818   NA 140 06/12/2012 0821   NA 142 12/10/2011 1234   K 4.3 03/05/2014 0818   K 4.5 06/12/2012 0821   K 3.5 12/10/2011 1234   CL 103 12/26/2012 0805   CL 107 06/12/2012 0821   CL 100 12/10/2011 1234   CO2 27 03/05/2014 0818   CO2 28 06/12/2012 0821   CO2 29 12/10/2011 1234   BUN 15.5 03/05/2014 0818   BUN 18 06/12/2012 0821   BUN 15 12/10/2011 1234   CREATININE 0.8 03/05/2014 0818   CREATININE 0.73 06/12/2012 0821   CREATININE 0.5* 12/10/2011 1234      Component Value Date/Time   CALCIUM 10.2 03/05/2014 0818   CALCIUM 9.4 06/12/2012 0821   CALCIUM 9.3 12/10/2011 1234   ALKPHOS 74 03/05/2014 0818  ALKPHOS 65 06/12/2012 0821   ALKPHOS 62 12/10/2011 1234   AST 19 03/05/2014 0818   AST 17 06/12/2012 0821   AST 15 12/10/2011 1234   ALT 13 03/05/2014 0818   ALT 15 06/12/2012 0821   ALT 17 12/10/2011 1234   BILITOT 0.44 03/05/2014 0818   BILITOT 0.4 06/12/2012 0821   BILITOT 0.60 12/10/2011 1234     Impression and Plan: 43 year old women with:  1. Rectal cancer: She is status post abdominal  perineal resection with ileostomy at [facility] on 04/07/2010 rectal adenocarcinoma. Surgical pathology revealed a tumor measuring 0.5 cm with no lymph node involvement or lymphovascular invasion. Surgical margins were clear. She received neoadjuvant 5-FU and radiation therapy and following surgery received adjuvant chemotherapy in the form of XELOX for 1 cycle [unable to tolerate oxaliplatin]. She then received Xeloda for 8 cycles.   CT scan done on 4/201 did not show any relapsed disease. She also had a colonoscopy in 2014 the was unremarkable. The plan is to repeat her CT scan in 02/2015. I will continue to follow her with a clinical visit every 6 months as well.  2. Age appropriate cancer screening: She is up-to-date at this point from a colonoscopy as well as mammography standpoint. She is still considering a hysterectomy and oophorectomy prophylactically.  [facility], MD 10/23/20158:54 AM

## 2014-09-11 IMAGING — CT CT ABD-PELV W/ CM
2 of 5 series · 16 of 46 positions shown, 18 images · IV contrast (omnipaque)
Comparison: CT of the chest, abdomen and pelvis 12/16/2012.

CLINICAL DATA: History of rectal cancer diagnosed in 8555 status
post surgical resection, chemotherapy and radiation therapy now
complete.

EXAM:
CT CHEST, ABDOMEN, AND PELVIS WITH CONTRAST
TECHNIQUE: Multidetector CT imaging of the chest, abdomen and pelvis was
performed following the standard protocol during bolus
administration of intravenous contrast.
CONTRAST:  100mL OMNIPAQUE IOHEXOL 300 MG/ML  SOLN

[Series 2: cap with st · axial · 0.73mm/px · z∈[-626,-36]mm · 13 of 134 slices shown, 15 images]
[im 8/134  soft-tissue]
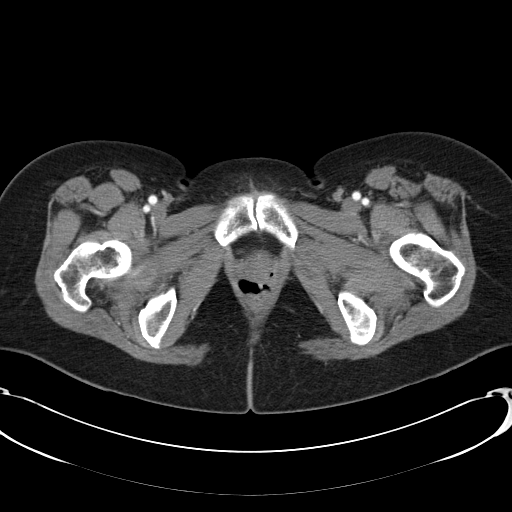
[im 8/134  bone]
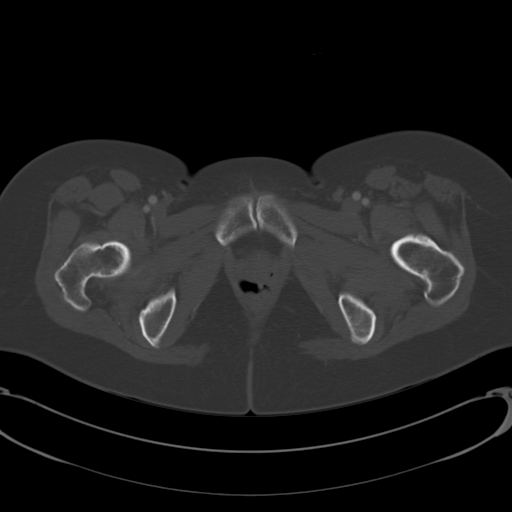
[im 16/134  soft-tissue]
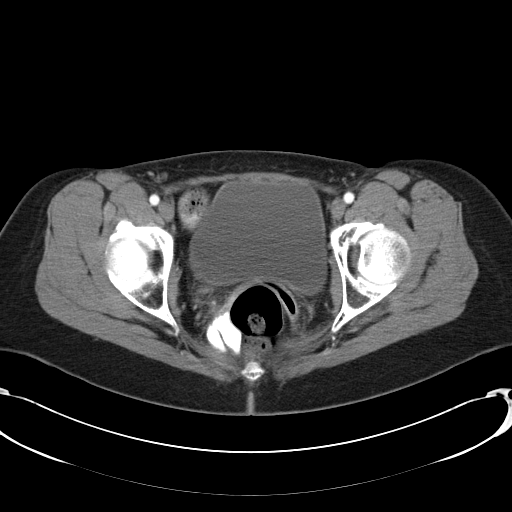
[im 32/134  soft-tissue]
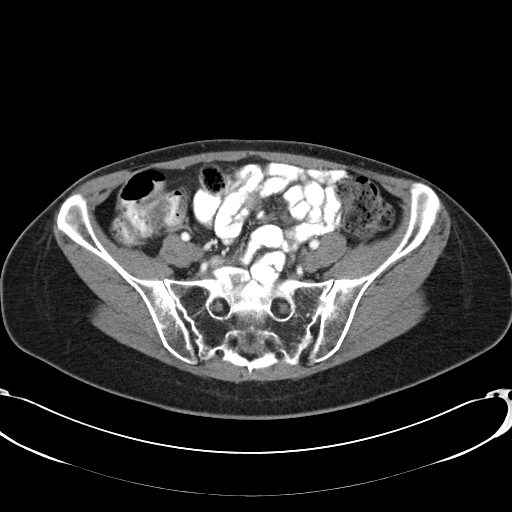
[im 40/134  soft-tissue]
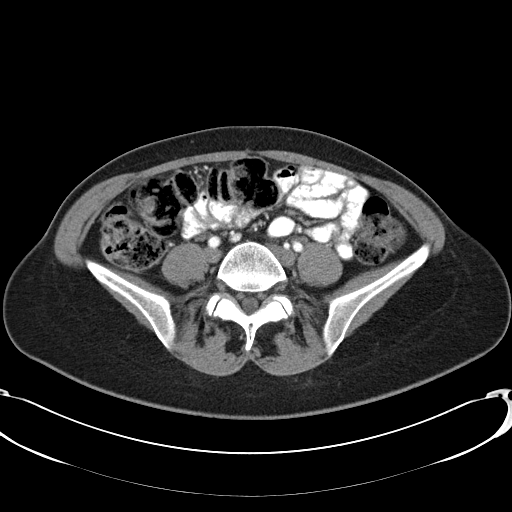
[im 47/134  soft-tissue]
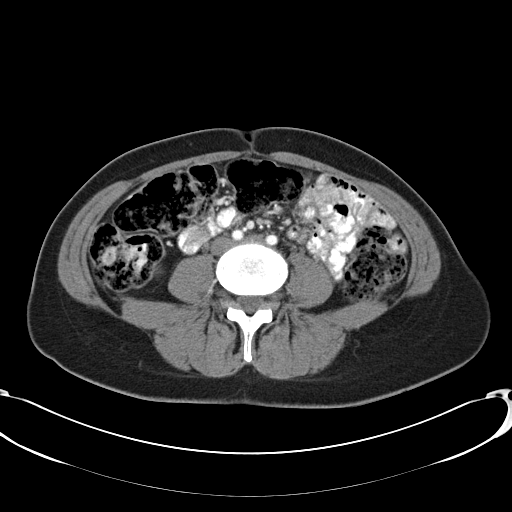
[im 55/134  soft-tissue]
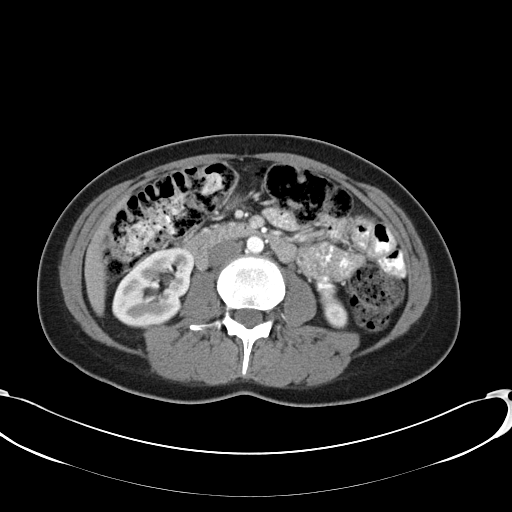
[im 71/134  soft-tissue]
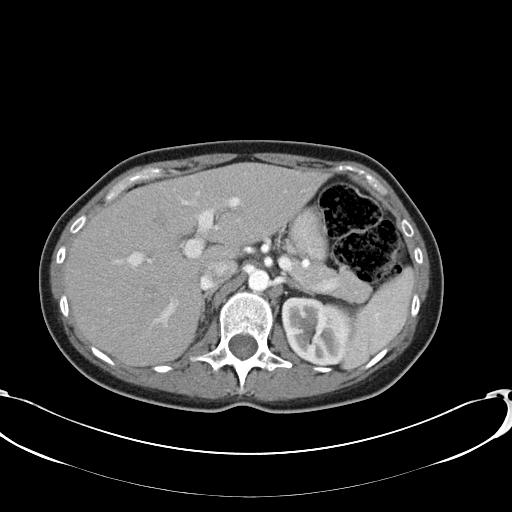
[im 79/134  soft-tissue]
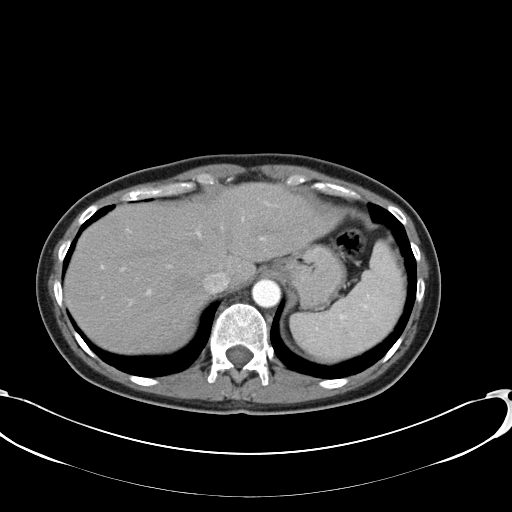
[im 87/134  soft-tissue]
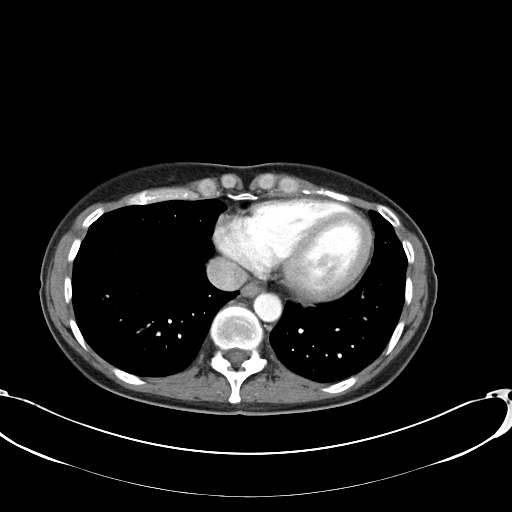
[im 87/134  bone]
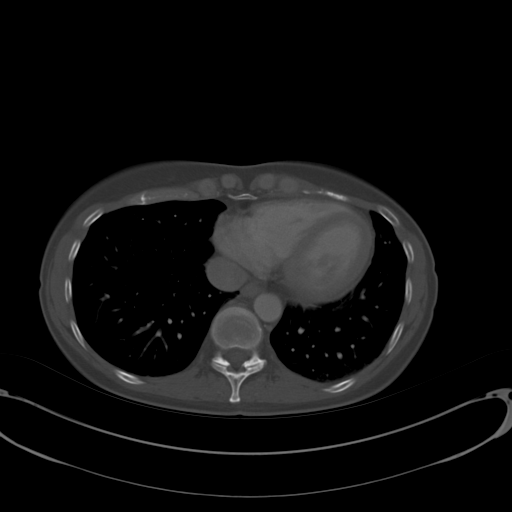
[im 94/134  soft-tissue]
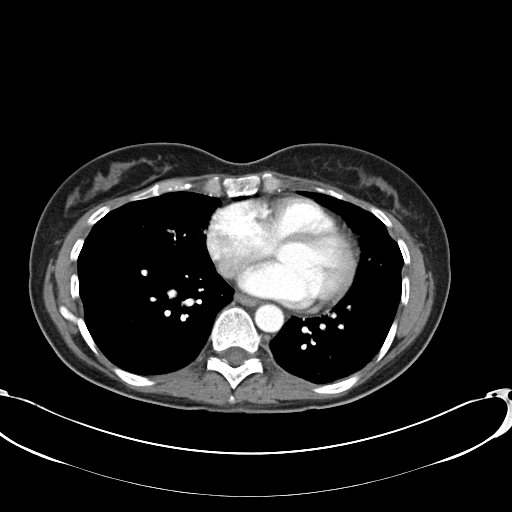
[im 102/134  soft-tissue]
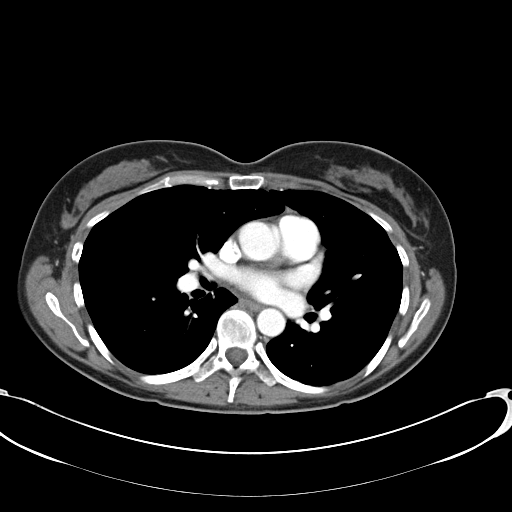
[im 118/134  soft-tissue]
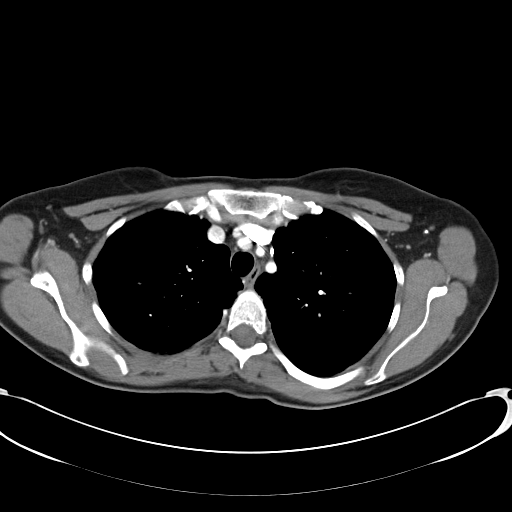
[im 126/134  soft-tissue]
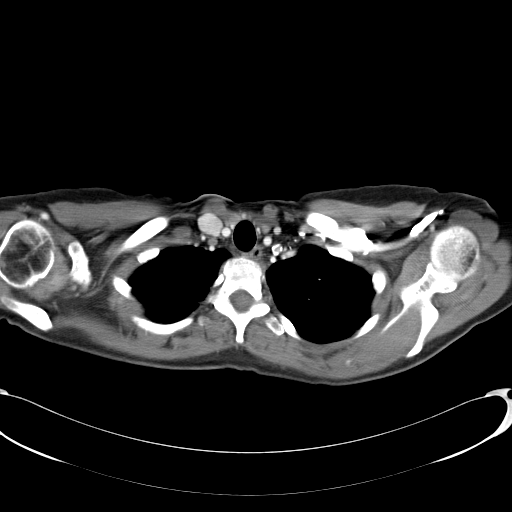

[Series 602: <mpr thick range> · coronal · 1.31mm/px · 3 of 74 slices shown]
[im 25/74  soft-tissue]
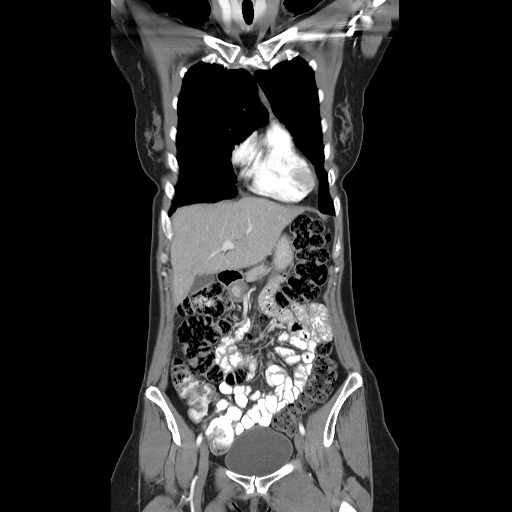
[im 33/74  soft-tissue]
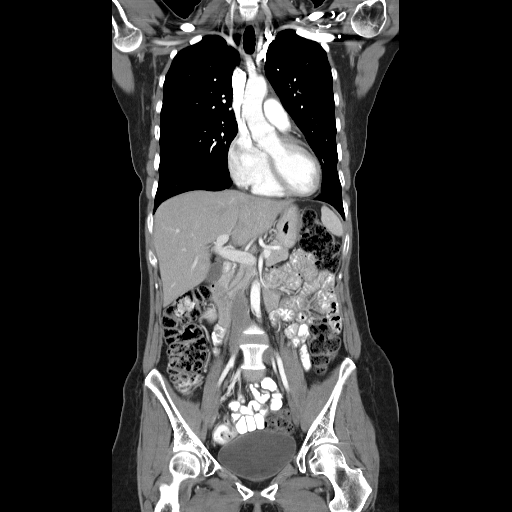
[im 41/74  soft-tissue]
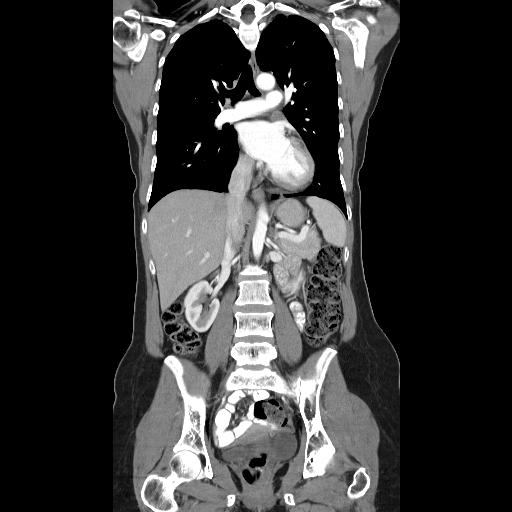

[16 of 46 positions shown; findings below may reference images not displayed]

FINDINGS: CT CHEST FINDINGS

Mediastinum: Heart size is normal. There is no significant
pericardial fluid, thickening or pericardial calcification. No
pathologically enlarged mediastinal or hilar lymph nodes. Esophagus
is unremarkable in appearance.

Lungs/Pleura: No suspicious appearing pulmonary nodules or masses.
No acute consolidative airspace disease. No pleural effusions.

Musculoskeletal: There are no aggressive appearing lytic or blastic
lesions noted in the visualized portions of the skeleton.

CT ABDOMEN AND PELVIS FINDINGS

Abdomen/Pelvis: 6 mm low attenuation lesion in segment 8 of the
liver (image 59 of series 2), too small to characterize, but
unchanged compared to prior study 12/26/2012, favored to represent a
small cyst or other benign lesion. No suspicious hepatic lesions are
noted at this time. The appearance of the gallbladder, pancreas,
spleen, bilateral adrenal glands and bilateral kidneys is
unremarkable.

No significant volume of ascites. No pneumoperitoneum. No pathologic
distention of small bowel. Normal appendix. No definite
lymphadenopathy identified within the abdomen or pelvis. Uterus and
ovaries are unremarkable in appearance. Status post partial rectal
resection with primary anastomosis in the lower rectum. No abnormal
soft tissue mass in this region to suggest local recurrence of
disease. No surrounding perirectal lymphadenopathy. Urinary bladder
is normal in appearance.

Musculoskeletal: There are no aggressive appearing lytic or blastic
lesions noted in the visualized portions of the skeleton.
IMPRESSION: 1. No findings to suggest local recurrence of disease or metastatic
disease to the chest, abdomen or pelvis.
2. 6 mm low attenuation lesion in segment 8 of the liver is too
small to definitively characterize, but is similar in retrospect
compared to prior studies dating back to 03/06/2010, and is
presumably benign (likely a tiny cyst).

## 2014-09-30 ENCOUNTER — Other Ambulatory Visit: Payer: Self-pay

## 2014-09-30 DIAGNOSIS — Z1231 Encounter for screening mammogram for malignant neoplasm of breast: Secondary | ICD-10-CM

## 2014-10-24 ENCOUNTER — Ambulatory Visit: Payer: 59

## 2014-12-25 ENCOUNTER — Telehealth: Payer: Self-pay | Admitting: Oncology

## 2014-12-25 NOTE — Telephone Encounter (Signed)
Lft msg for pt confirming MD visit moved from pm to am... KJ mailed sch

## 2015-03-04 ENCOUNTER — Other Ambulatory Visit (HOSPITAL_BASED_OUTPATIENT_CLINIC_OR_DEPARTMENT_OTHER): Payer: 59

## 2015-03-04 ENCOUNTER — Encounter (HOSPITAL_COMMUNITY): Payer: Self-pay

## 2015-03-04 ENCOUNTER — Ambulatory Visit (HOSPITAL_COMMUNITY)
Admission: RE | Admit: 2015-03-04 | Discharge: 2015-03-04 | Disposition: A | Discharge status: Home / Self Care | Payer: 59 | Source: Ambulatory Visit | Attending: Oncology | Admitting: Oncology

## 2015-03-04 DIAGNOSIS — Z923 Personal history of irradiation: Secondary | ICD-10-CM | POA: Diagnosis not present

## 2015-03-04 DIAGNOSIS — C20 Malignant neoplasm of rectum: Secondary | ICD-10-CM | POA: Insufficient documentation

## 2015-03-04 DIAGNOSIS — Z9889 Other specified postprocedural states: Secondary | ICD-10-CM | POA: Diagnosis not present

## 2015-03-04 DIAGNOSIS — Z9221 Personal history of antineoplastic chemotherapy: Secondary | ICD-10-CM | POA: Diagnosis not present

## 2015-03-04 LAB — COMPREHENSIVE METABOLIC PANEL (CC13)
ALBUMIN: 4.3 g/dL (ref 3.5–5.0)
ALK PHOS: 73 U/L (ref 40–150)
ALT: 15 U/L (ref 0–55)
AST: 19 U/L (ref 5–34)
Anion Gap: 11 mEq/L (ref 3–11)
BUN: 17.3 mg/dL (ref 7.0–26.0)
CHLORIDE: 106 meq/L (ref 98–109)
CO2: 25 mEq/L (ref 22–29)
Calcium: 9.3 mg/dL (ref 8.4–10.4)
Creatinine: 0.8 mg/dL (ref 0.6–1.1)
EGFR: >90 mL/min/{1.73_m2} (ref >90)
GLUCOSE: 93 mg/dL (ref 70–140)
Potassium: 4.5 mEq/L (ref 3.5–5.1)
SODIUM: 142 meq/L (ref 136–145)
Total Bilirubin: 0.33 mg/dL (ref 0.20–1.20)
Total Protein: 6.8 g/dL (ref 6.4–8.3)

## 2015-03-04 LAB — CBC WITH DIFFERENTIAL/PLATELET
BASO%: 0.9 % (ref 0.0–2.0)
Basophils Absolute: 0 10*3/uL (ref 0.0–0.1)
EOS%: 3.6 % (ref 0.0–7.0)
Eosinophils Absolute: 0.1 10*3/uL (ref 0.0–0.5)
HEMATOCRIT: 39.9 % (ref 34.8–46.6)
HEMOGLOBIN: 13.3 g/dL (ref 11.6–15.9)
LYMPH#: 1 10*3/uL (ref 0.9–3.3)
LYMPH%: 28.7 % (ref 14.0–49.7)
MCH: 31 pg (ref 25.1–34.0)
MCHC: 33.3 g/dL (ref 31.5–36.0)
MCV: 93 fL (ref 79.5–101.0)
MONO#: 0.3 10*3/uL (ref 0.1–0.9)
MONO%: 9.4 % (ref 0.0–14.0)
NEUT#: 1.9 10*3/uL (ref 1.5–6.5)
NEUT%: 57.4 % (ref 38.4–76.8)
Platelets: 192 10*3/uL (ref 145–400)
RBC: 4.29 10*6/uL (ref 3.70–5.45)
RDW: 12.1 % (ref 11.2–14.5)
WBC: 3.3 10*3/uL — ABNORMAL LOW (ref 3.9–10.3)

## 2015-03-04 MED ORDER — IOHEXOL 300 MG/ML  SOLN
50.0000 mL | Freq: Once | INTRAMUSCULAR | Status: AC | PRN
Start: 2015-03-04 — End: 2015-03-04
  Administered 2015-03-04: 50 mL via ORAL

## 2015-03-04 MED ORDER — IOHEXOL 300 MG/ML  SOLN
100.0000 mL | Freq: Once | INTRAMUSCULAR | Status: AC | PRN
  Administered 2015-03-04: 100 mL via INTRAVENOUS

## 2015-03-05 LAB — CEA: CEA: 1.4 ng/mL (ref 0.0–5.0)

## 2015-03-06 ENCOUNTER — Ambulatory Visit (HOSPITAL_BASED_OUTPATIENT_CLINIC_OR_DEPARTMENT_OTHER): Payer: 59 | Admitting: Oncology

## 2015-03-06 ENCOUNTER — Ambulatory Visit: Payer: 59 | Admitting: Oncology

## 2015-03-06 ENCOUNTER — Telehealth: Payer: Self-pay | Admitting: Oncology

## 2015-03-06 VITALS — BP 118/50 | HR 98 | Temp 98.2°F | Resp 18 | Ht 68.0 in | Wt 157.3 lb

## 2015-03-06 DIAGNOSIS — C20 Malignant neoplasm of rectum: Secondary | ICD-10-CM

## 2015-03-06 NOTE — Telephone Encounter (Signed)
Gave avs & calendar for October. °

## 2015-03-06 NOTE — Progress Notes (Signed)
Hematology and Oncology Follow Up Visit  Denise Michael [id number] [birth year] 44 y.o. 03/06/2015 8:40 AM   Principle Diagnosis: 44 year old with with rectal adenocarcinoma presumed T2 or T3 disease in 2011  Prior Therapy: 1. Neoadjuvant 5-FU with radiation therapy  2. She is status post abdominoperineal resection with ileostomy at [facility] on Apr 08, 2010. Surgical pathology revealed a tumor measuring 0.5 cm with no lymph node involvement or lymphovascular invasion. Surgical margins were clear. She had an ileostomy reversal in 2011 as well. 3. Following surgery received adjuvant chemotherapy in the form of XELOX for 1 cycle and then was not able to tolerate oxaliplatin. She continued with Xeloda for 8 cycles.   Current therapy: Observation and follow up.   Interim History: [name] presents today for a follow up visit. Since the last visit, she continues to do relatively well. She reports occasional diarrhea that have been manageable but at times can be disruptive to her quality of life. She does not report any hematochezia or melena. He does not report any weight loss or appetite changes. Her weight is stable. She continues to perform activities of daily living without any hindrance or decline. She does not report any headaches blurred vision or double vision. Has not reported any changes in her mental status. She is not reporting any chest pain or difficulty breathing. Has not reported any nausea vomiting or hematochezia. She does not report any frequency urgency or hesitancy. She has not reported any musculoskeletal complaints. She continues to work full-time without any decline. Rest of her review of systems unremarkable.   Medications: I have reviewed the patient's current medications.  Current outpatient prescriptions:  .  citalopram (CELEXA) 10 MG tablet, Take 20 mg by mouth daily. , Disp: , Rfl:   Allergies: No Known Allergies  Past Medical History, Surgical history, Social  history, and Family History were reviewed and updated.   Physical Exam: Blood pressure 118/50, pulse 98, temperature 98.2 F (36.8 C), temperature source Oral, resp. rate 18, height 5\' 8"  (1.727 m), weight 157 lb 4.8 oz (71.351 kg), SpO2 100 %. ECOG: 0 General appearance: alert awake appeared in no active distress. Head: Normocephalic, without obvious abnormality, atraumatic Neck: no adenopathy. Lymph nodes: Cervical, supraclavicular, and axillary nodes normal. Heart:regular rate and rhythm, S1, S2 normal, no murmur, click, rub or gallop Lung:chest clear, no wheezing, rales, normal symmetric air entry Abdomin: soft, non-tender, without masses or organomegaly EXT:no erythema, induration, or nodules   Lab Results: Lab Results  Component Value Date   WBC 3.3* 03/04/2015   HGB 13.3 03/04/2015   HCT 39.9 03/04/2015   MCV 93.0 03/04/2015   PLT 192 03/04/2015     Chemistry      Component Value Date/Time   NA 142 03/04/2015 1023   NA 140 06/12/2012 0821   NA 142 12/10/2011 1234   K 4.5 03/04/2015 1023   K 4.5 06/12/2012 0821   K 3.5 12/10/2011 1234   CL 103 12/26/2012 0805   CL 107 06/12/2012 0821   CL 100 12/10/2011 1234   CO2 25 03/04/2015 1023   CO2 28 06/12/2012 0821   CO2 29 12/10/2011 1234   BUN 17.3 03/04/2015 1023   BUN 18 06/12/2012 0821   BUN 15 12/10/2011 1234   CREATININE 0.8 03/04/2015 1023   CREATININE 0.73 06/12/2012 0821   CREATININE 0.5* 12/10/2011 1234      Component Value Date/Time   CALCIUM 9.3 03/04/2015 1023   CALCIUM 9.4 06/12/2012 4098  CALCIUM 9.3 12/10/2011 1234   ALKPHOS 73 03/04/2015 1023   ALKPHOS 65 06/12/2012 0821   ALKPHOS 62 12/10/2011 1234   AST 19 03/04/2015 1023   AST 17 06/12/2012 0821   AST 15 12/10/2011 1234   ALT 15 03/04/2015 1023   ALT 15 06/12/2012 0821   ALT 17 12/10/2011 1234   BILITOT 0.33 03/04/2015 1023   BILITOT 0.4 06/12/2012 0821   BILITOT 0.60 12/10/2011 1234      EXAM: CT CHEST, ABDOMEN, AND PELVIS WITH  CONTRAST  TECHNIQUE: Multidetector CT imaging of the chest, abdomen and pelvis was performed following the standard protocol during bolus administration of intravenous contrast.  CONTRAST: 139mL OMNIPAQUE IOHEXOL 300 MG/ML SOLN, 48mL OMNIPAQUE IOHEXOL 300 MG/ML SOLN  COMPARISON: 03/05/2014  FINDINGS: CT CHEST FINDINGS  Mediastinum/Nodes: Heart is normal in size. No pericardial effusion.  No suspicious mediastinal, hilar, or axillary lymphadenopathy.  Visualized thyroid is unremarkable.  Lungs/Pleura: Visualized lungs are clear.  No suspicious pulmonary nodules.  No focal consolidation.  No pleural effusion or pneumothorax.  Musculoskeletal: Visualized osseous structures are within normal limits.  CT ABDOMEN PELVIS FINDINGS  Hepatobiliary: Two irregular hypoenhancing lesions in the right hepatic lobe, measuring up to 5 mm, chronic, likely reflecting small cysts.  No new/suspicious enhancing lesions.  Gallbladder is unremarkable. No intrahepatic or extrahepatic ductal dilatation.  Pancreas: Within normal limits.  Spleen: Within normal limits.  Adrenals/Urinary Tract: Adrenal glands are within normal limits.  Kidneys are within normal limits. No hydronephrosis.  Bladder is mildly thick-walled although underdistended.  Stomach/Bowel: Stomach is within normal limits.  No evidence of bowel obstruction.  Normal appendix.  Status post distal colonic resection with lower rectal/anal anastomosis (series 2/ image 127).  Moderate colonic stool burden.  Vascular/Lymphatic: No evidence of abdominal aortic aneurysm.  No suspicious abdominopelvic lymphadenopathy.  Reproductive: Uterus is unremarkable.  Bilateral ovaries are unremarkable.  Other: No abdominopelvic ascites.  Musculoskeletal: Visualized osseous structures are within normal limits.  IMPRESSION: Status post distal colonic resection with lower  rectal/anal anastomosis.  No evidence of recurrent or metastatic disease.    Impression and Plan: 44 year old women with:    1. Rectal cancer: She is status post abdominal perineal resection with ileostomy at [facility] on 04/07/2010 rectal adenocarcinoma. Surgical pathology revealed a tumor measuring 0.5 cm with no lymph node involvement or lymphovascular invasion. Surgical margins were clear. She received neoadjuvant 5-FU and radiation therapy and following surgery received adjuvant chemotherapy in the form of XELOX for 1 cycle [unable to tolerate oxaliplatin]. She then received Xeloda for 8 cycles.   CT scan done on 03/04/2015 did not show any relapsed disease. She also had a colonoscopy in 2014 the was unremarkable. The plan is to continue with active surveillance and repeat a scan one last time in April 2017.  2. Age appropriate cancer screening: She is up-to-date at this point from a colonoscopy as well as mammography standpoint. She is scheduled to have one in 2017. We also discussed the possibility of having a hysterectomy and oophorectomy which she is contemplating for the time being.  3. Follow-up call be in 6 months for a clinical visit and laboratory testing.  [facility], MD 4/21/20168:40 AM

## 2015-05-27 ENCOUNTER — Encounter: Payer: Self-pay | Admitting: Genetic Counselor

## 2015-09-05 ENCOUNTER — Ambulatory Visit: Payer: 59 | Admitting: Oncology

## 2015-09-05 ENCOUNTER — Other Ambulatory Visit: Payer: 59

## 2016-05-28 ENCOUNTER — Encounter: Payer: Self-pay | Admitting: Genetic Counselor

## 2017-02-14 ENCOUNTER — Telehealth: Payer: Self-pay | Admitting: *Deleted

## 2017-02-14 NOTE — Telephone Encounter (Signed)
Returned patient's call regarding bloody urine. Informed patient that she has not seen Dr. Alen Blew in two years and needs to be seen by her urologist and PCP. Patient verbalized understanding.

## 2017-03-01 DIAGNOSIS — R3129 Other microscopic hematuria: Secondary | ICD-10-CM | POA: Diagnosis not present

## 2017-03-01 DIAGNOSIS — R319 Hematuria, unspecified: Secondary | ICD-10-CM | POA: Diagnosis not present

## 2017-07-11 ENCOUNTER — Other Ambulatory Visit: Payer: Self-pay | Admitting: Oncology

## 2017-07-11 DIAGNOSIS — Z1231 Encounter for screening mammogram for malignant neoplasm of breast: Secondary | ICD-10-CM

## 2017-07-27 ENCOUNTER — Ambulatory Visit: Payer: 59

## 2017-09-05 DIAGNOSIS — R3129 Other microscopic hematuria: Secondary | ICD-10-CM | POA: Diagnosis not present

## 2018-03-28 DIAGNOSIS — G4700 Insomnia, unspecified: Secondary | ICD-10-CM | POA: Diagnosis not present

## 2018-03-28 DIAGNOSIS — Z720 Tobacco use: Secondary | ICD-10-CM | POA: Diagnosis not present

## 2018-04-13 ENCOUNTER — Ambulatory Visit: Payer: 59

## 2018-04-20 DIAGNOSIS — G4710 Hypersomnia, unspecified: Secondary | ICD-10-CM | POA: Diagnosis not present

## 2018-10-25 ENCOUNTER — Encounter: Payer: Self-pay | Admitting: Emergency Medicine

## 2018-10-25 DIAGNOSIS — F418 Other specified anxiety disorders: Principal | ICD-10-CM

## 2018-10-25 DIAGNOSIS — F3341 Major depressive disorder, recurrent, in partial remission: Secondary | ICD-10-CM

## 2018-11-02 ENCOUNTER — Ambulatory Visit: Payer: Self-pay | Admitting: Psychiatry

## 2018-11-07 ENCOUNTER — Other Ambulatory Visit: Payer: Self-pay | Admitting: Psychiatry

## 2018-11-10 NOTE — Telephone Encounter (Signed)
Need to review paper chart  

## 2018-11-22 ENCOUNTER — Encounter: Payer: Self-pay | Admitting: Psychiatry

## 2018-11-22 ENCOUNTER — Ambulatory Visit: Payer: 59 | Admitting: Psychiatry

## 2018-11-22 VITALS — BP 108/70 | HR 64 | Ht 68.0 in | Wt 151.0 lb

## 2018-11-22 DIAGNOSIS — F3341 Major depressive disorder, recurrent, in partial remission: Secondary | ICD-10-CM | POA: Diagnosis not present

## 2018-11-22 DIAGNOSIS — F418 Other specified anxiety disorders: Secondary | ICD-10-CM

## 2018-11-22 MED ORDER — BUPROPION HCL ER (SR) 150 MG PO TB12: 150.0000 mg | ORAL_TABLET | Freq: Every morning | ORAL | 3 refills | Status: DC

## 2018-11-22 MED ORDER — CITALOPRAM HYDROBROMIDE 40 MG PO TABS: 40.0000 mg | ORAL_TABLET | Freq: Every day | ORAL | 3 refills | Status: DC

## 2018-11-22 NOTE — Progress Notes (Signed)
[facility]  Patient ID: Denise Michael,  MRN: [id number]  PCP: Maurice Small, MD  Date of Evaluation: 11/22/2018 Time spent:10 minutes  Chief Complaint:  Chief Complaint    Depression      HISTORY/CURRENT STATUS: Denise Michael is seen individually face-to-face with consent not collateral for psychiatric interview and exam in evaluation and management of her 5 years of Celexa in this office and 2 years of Wellbutrin for major depression. Her sister has experienced manic psychosis requiring hospitalization slowly improving with complex medications.  She must help her parents significantly while she is now 8 years out from treatment for colon cancer herself.  She does not manifest a definite generalized anxiety in the interim.  Her alternation of 40 mg Celexa 1 night and 20 mg the next warrants for depression this fall advancing the dose slightly.  Passive smoking cessation having side effects from Chantix in the past doing best with NicoDerm patch over-the-counter is under way.   Individual Medical History/ Review of Systems: Changes? :No   Allergies: Patient has no known allergies.  Current Medications:  Current Outpatient Medications:  .  buPROPion (WELLBUTRIN SR) 150 MG 12 hr tablet, Take 1 tablet (150 mg total) by mouth every morning., Disp: 90 tablet, Rfl: 3 .  buPROPion (WELLBUTRIN XL) 150 MG 24 hr tablet, Take 150 mg by mouth every morning., Disp: , Rfl:  .  buPROPion (ZYBAN) 150 MG 12 hr tablet, Take 150 mg by mouth every morning., Disp: , Rfl:  .  citalopram (CELEXA) 40 MG tablet, Take 1 tablet (40 mg total) by mouth at bedtime., Disp: 90 tablet, Rfl: 3 Medication Side Effects: none  Family Medical/ Social History: Changes? Yes sister has bipolar disorder new onset.  MENTAL HEALTH EXAM: Muscle Strength 5/5 and postural reflexes 0/0 with AIMS equals 0. Blood pressure 108/70, pulse 64, height 5\' 8"  (1.727 m), weight 151 lb (68.5 kg).Body mass index is 22.96  kg/m.  General Appearance: Casual and Fairly Groomed  Eye Contact:  Good  Speech:  Clear and Coherent  Volume:  Normal  Mood:  Dysphoric and Euthymic  Affect:  Full Range  Thought Process:  Goal Directed  Orientation:  Full (Time, Place, and Person)  Thought Content: Rumination   Suicidal Thoughts:  No  Homicidal Thoughts:  No  Memory:  Immediate;   Good Remote;   Good  Judgement:  Fair  Insight:  Fair  Psychomotor Activity:  Normal  Concentration:  Concentration: Good and Attention Span: Good  Recall:  Good  Fund of Knowledge: Good  Language: Good  Assets:  Resilience Social Support Talents/Skills  ADL's:  Intact  Cognition: WNL  Prognosis:  Good    DIAGNOSES:    ICD-10-CM   1. 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  Wellbutrin 150 mg SR every morning for depression is also sent as a 94-month supply and 3 refills.  Medication education is updated to return in 1 year or sooner if needed.   Delight Hoh, MD

## 2019-01-07 ENCOUNTER — Other Ambulatory Visit: Payer: Self-pay | Admitting: Psychiatry

## 2019-05-09 ENCOUNTER — Other Ambulatory Visit: Payer: Self-pay | Admitting: Psychiatry

## 2019-05-09 DIAGNOSIS — F3341 Major depressive disorder, recurrent, in partial remission: Secondary | ICD-10-CM

## 2019-05-10 NOTE — Telephone Encounter (Signed)
November 22, 2018, citalopram was increased to 40 mg nightly sent as a 26-month supply and 3 refills to [facility] [address] now considering their request for previous year's prescription for 20 mg alternating dose.  From last appointment, we will escribe the citalopram to 40 mg nightly #90 with 1 refill.

## 2019-05-10 NOTE — Telephone Encounter (Signed)
Looks like this is a rx from 2019, is this current directions?

## 2019-10-10 ENCOUNTER — Other Ambulatory Visit: Payer: Self-pay | Admitting: Family Medicine

## 2019-10-10 DIAGNOSIS — Z1231 Encounter for screening mammogram for malignant neoplasm of breast: Secondary | ICD-10-CM

## 2019-11-26 ENCOUNTER — Other Ambulatory Visit: Payer: Self-pay

## 2019-11-26 ENCOUNTER — Ambulatory Visit (INDEPENDENT_AMBULATORY_CARE_PROVIDER_SITE_OTHER): Payer: 59 | Admitting: Psychiatry

## 2019-11-26 ENCOUNTER — Encounter: Payer: Self-pay | Admitting: Psychiatry

## 2019-11-26 VITALS — Ht 68.0 in | Wt 136.0 lb

## 2019-11-26 DIAGNOSIS — F17200 Nicotine dependence, unspecified, uncomplicated: Secondary | ICD-10-CM | POA: Diagnosis not present

## 2019-11-26 DIAGNOSIS — F3341 Major depressive disorder, recurrent, in partial remission: Secondary | ICD-10-CM

## 2019-11-26 DIAGNOSIS — F418 Other specified anxiety disorders: Secondary | ICD-10-CM

## 2019-11-26 MED ORDER — CITALOPRAM HYDROBROMIDE 40 MG PO TABS: 40.0000 mg | ORAL_TABLET | Freq: Every day | ORAL | 3 refills | Status: DC

## 2019-11-26 MED ORDER — CLONAZEPAM 0.5 MG PO TABS: 0.5000 mg | ORAL_TABLET | Freq: Two times a day (BID) | ORAL | 2 refills | Status: DC | PRN

## 2019-11-26 NOTE — Progress Notes (Signed)
[facility]  Patient ID: Denise Michael,  MRN: [id number]  PCP: Maurice Small, MD  Date of Evaluation: 11/26/2019 Time spent:20 minutes from 0900 to 0920  Chief Complaint:  Chief Complaint    Depression; Addiction Problem; Stress      HISTORY/CURRENT STATUS: Denise Michael is seen onsite in office face-to-face 15 minutes individually with consent with epic collateral for psychiatric interview and exam in 77-month evaluation and management of major depression with anxious distress, major stressors, and continued tobacco despite 15 pound weight loss unexpected and family history of addiction.  In the interim year, she has less depression on the Celexa increase last appointment by 20 mg every other day to 40 mg every bedtime tolerated well without side effects.  However, her anxiety is more consequential with both parents declining for whom she cares, sister's bipolar disorder still a struggle for medication relief though sister has 5 years of sobriety from alcohol dependence, and the environment with Covid and work stressors.  [place] registry is negative.  She has lost 15 pounds for which she has work-up underway with PCP and specialist.  She considers herself prone to addiction but has reduced cigarettes so that she only smokes a few cigarettes in the morning and uses the NicoDerm patch the rest of the day.  She has decided against restarting long term bupropion reporting she stopped over a year ago though she did accept E scription for a year's supply last appointment January 2020.  She declines other smoking cessation medications or aids currently including smoking cessation programs.  She concludes to continue chronic citalopram prevention of further depression.  She has no interim mania, suicidality, psychosis or delirium, including no hypomania with the higher dose antidepressant Celexa 40 mg nightly over the last year despite sister having bipolar and the patient deviously diagnosed as  bipolar II by Dr. Candis Schatz more evident to be major depression over the course of treatment with me.  Depression      The patient presents with depression.  This is a recurrent problem.  The current episode started more than 1 year ago.   The onset quality is sudden.   The problem occurs intermittently.  The problem has been gradually improving since onset.  Associated symptoms include fatigue, appetite change, indigestion and sad.  Associated symptoms include no decreased concentration, no hopelessness, does not have insomnia, no decreased interest, no headaches and no suicidal ideas.     The symptoms are aggravated by family issues, work stress and social issues.  Past treatments include SSRIs - Selective serotonin reuptake inhibitors and other medications.  Compliance with treatment is good.  Past compliance problems include medical issues.  Previous treatment provided significant relief.  Risk factors include a change in medication usage/dosage, family history of mental illness, family history, major life event and stress.   Past medical history includes bipolar disorder, depression and mental health disorder.     Pertinent negatives include no life-threatening condition, no physical disability, no recent psychiatric admission, no eating disorder, no obsessive-compulsive disorder, no post-traumatic stress disorder, no suicide attempts and no head trauma.   Individual Medical History/ Review of Systems: Changes? :Yes She does not have time to eat and stress has reduced her appetite.  Weight is down from 151 pounds to 136 pounds in the last year.  She had general medical exam and laboratory tests with PCP, has urology appointment, and has colonoscopy scheduled having previous rectal cancer.  She continues to attempt to reduce cigarette smoking and to  eat more. Past ROS notes rectal carcinoma years ago, GERD, headaches, menopause, TMJ and dental malocclusion.  Allergies: Patient has no known  allergies.  Current Medications:  Current Outpatient Medications:  .  buPROPion (ZYBAN) 150 MG 12 hr tablet, Take 150 mg by mouth every morning., Disp: , Rfl:  .  citalopram (CELEXA) 40 MG tablet, Take 1 tablet (40 mg total) by mouth at bedtime., Disp: 90 tablet, Rfl: 3 .  clonazePAM (KLONOPIN) 0.5 MG tablet, Take 1 tablet (0.5 mg total) by mouth 2 (two) times daily as needed for anxiety., Disp: 60 tablet, Rfl: 2   Medication Side Effects: none  Family Medical/ Social History: Changes? No sister having difficult to control bipolar disorder with multiple medication changes but has 5 years of sobriety from alcohol dependence.  Parents are both declining needing patient's continued care as her greatest deterrent to suicide ideation over the last 5 years.  The patient has depression since her teens treated in [place] then by Dr. Candis Schatz now with me for maintenance care since 07/27/2016 being a colon cancer survivor working hard to support and keep up parents and sister.  MENTAL HEALTH EXAM:  Height 5\' 8"  (1.727 m), weight 136 lb (61.7 kg).Body mass index is 20.68 kg/m. Muscle strengths and tone 5/5, postural reflexes and gait 0/0, and AIMS = 0 otherwise deferred for coronavirus shutdown  General Appearance: Casual, Fairly Groomed and obvious appearance of weight loss compared to previous appoint  Eye Contact:  Good  Speech:  Clear and Coherent, Normal Rate and Talkative  Volume:  Normal  Mood:  Anxious, Dysphoric and Euthymic  Affect:  Congruent, Depressed, Full Range and Anxious  Thought Process:  Coherent, Goal Directed, Irrelevant and Descriptions of Associations: Circumstantial  Orientation:  Full (Time, Place, and Person)  Thought Content: Logical, Obsessions and Rumination   Suicidal Thoughts:  No  Homicidal Thoughts:  No  Memory:  Immediate;   Good Remote;   Good  Judgement:  Fair  Insight:  Fair  Psychomotor Activity:  Normal and Mannerisms  Concentration:  Concentration: Fair  and Attention Span: Good  Recall:  Good  Fund of Knowledge: Good  Language: Good  Assets:  Desire for Improvement Social Support Vocational/Educational  ADL's:  Intact  Cognition: WNL  Prognosis:  Good    DIAGNOSES:    ICD-10-CM   1. Major depressive disorder, recurrent episode, in partial remission with anxious distress (HCC)  F33.41 citalopram (CELEXA) 40 MG tablet   F41.8 clonazePAM (KLONOPIN) 0.5 MG tablet  2. Tobacco use disorder, moderate, dependence  F17.200     Receiving Psychotherapy: No    RECOMMENDATIONS: For all things considered in psychosupportive psychoeducation intrgrating past and present symptoms and treatment needs, we cautiously add and monitor Klonopin at this time as appropriate especially with a 15 pound weight loss apparently from anxiety.  She is E scribed Klonopin 0.5 mg twice daily as needed for anxiety and insomnia sent as #60 with 2 refills to [facility] on [address].  She continues Celexa 40 mg every bedtime sent as #90 with 3 refills to [facility] on [address] for major depression with anxious distress in the setting of major stressors in daily life likely to get worse before better.  Over 50% of the 20-minute face-to-face time is spent in 10 minutes total counseling and coordination of care patient declining other therapy having no time for such as she updates medical care with prevention, safety hygiene, and stress management by CBT behavioral nutrition, sleep hygiene, object  relations and frustration management.  She returns in 1 year or sooner if needed.   Delight Hoh, MD

## 2019-12-05 ENCOUNTER — Other Ambulatory Visit: Payer: Self-pay

## 2019-12-05 ENCOUNTER — Ambulatory Visit
Admission: RE | Admit: 2019-12-05 | Discharge: 2019-12-05 | Disposition: A | Discharge status: Home / Self Care | Payer: 59 | Source: Ambulatory Visit | Attending: Family Medicine | Admitting: Family Medicine

## 2019-12-05 DIAGNOSIS — Z1231 Encounter for screening mammogram for malignant neoplasm of breast: Secondary | ICD-10-CM

## 2019-12-06 ENCOUNTER — Other Ambulatory Visit: Payer: Self-pay | Admitting: Family Medicine

## 2019-12-06 DIAGNOSIS — R928 Other abnormal and inconclusive findings on diagnostic imaging of breast: Secondary | ICD-10-CM

## 2020-01-15 ENCOUNTER — Other Ambulatory Visit: Payer: 59

## 2020-01-25 ENCOUNTER — Other Ambulatory Visit: Payer: Self-pay

## 2020-01-25 ENCOUNTER — Ambulatory Visit
Admission: RE | Admit: 2020-01-25 | Discharge: 2020-01-25 | Disposition: A | Discharge status: Home / Self Care | Payer: 59 | Source: Ambulatory Visit | Attending: Family Medicine | Admitting: Family Medicine

## 2020-01-25 ENCOUNTER — Ambulatory Visit: Payer: 59

## 2020-01-25 DIAGNOSIS — R928 Other abnormal and inconclusive findings on diagnostic imaging of breast: Secondary | ICD-10-CM

## 2020-04-21 ENCOUNTER — Other Ambulatory Visit: Payer: Self-pay | Admitting: Psychiatry

## 2020-04-21 DIAGNOSIS — F3341 Major depressive disorder, recurrent, in partial remission: Secondary | ICD-10-CM

## 2020-04-21 NOTE — Telephone Encounter (Signed)
Next apt 11/25/2020

## 2020-06-27 ENCOUNTER — Encounter: Payer: Self-pay | Admitting: Genetic Counselor

## 2020-09-04 ENCOUNTER — Encounter: Payer: Self-pay | Admitting: Psychiatry

## 2020-10-28 ENCOUNTER — Ambulatory Visit: Payer: 59 | Admitting: Psychiatry

## 2020-11-25 ENCOUNTER — Ambulatory Visit: Payer: 59 | Admitting: Psychiatry

## 2021-03-11 ENCOUNTER — Ambulatory Visit: Payer: 59 | Admitting: Behavioral Health

## 2021-03-12 ENCOUNTER — Other Ambulatory Visit: Payer: Self-pay

## 2021-03-12 ENCOUNTER — Ambulatory Visit (INDEPENDENT_AMBULATORY_CARE_PROVIDER_SITE_OTHER): Payer: 59 | Admitting: Behavioral Health

## 2021-03-12 ENCOUNTER — Encounter: Payer: Self-pay | Admitting: Behavioral Health

## 2021-03-12 DIAGNOSIS — F3341 Major depressive disorder, recurrent, in partial remission: Secondary | ICD-10-CM

## 2021-03-12 DIAGNOSIS — F418 Other specified anxiety disorders: Secondary | ICD-10-CM

## 2021-03-12 MED ORDER — CLONAZEPAM 0.5 MG PO TABS: 0.5000 mg | ORAL_TABLET | Freq: Two times a day (BID) | ORAL | 0 refills | Status: DC | PRN

## 2021-03-12 MED ORDER — CITALOPRAM HYDROBROMIDE 40 MG PO TABS: 40.0000 mg | ORAL_TABLET | Freq: Every day | ORAL | 3 refills | Status: DC

## 2021-03-12 NOTE — Progress Notes (Signed)
Denise Michael [id number] 11/26/1970 50 y.o.  Subjective: " Im just here to get my refills".   Patient ID:  Denise Michael is a 50 y.o. (DOB 02/07/1971) female.  Chief Complaint: No chief complaint on file.   HPI:  Denise Michael presents to the office today for follow-up and medication refills. She is a former patient of Dr. Creig Hines. She said that,  "Im feeling very well overall and I am at a manageable level". She said I was seeing Dr. Creig Hines on yearly basis and would like to keep it that way for now unless there are changes. She reports anxiety today at 1 and depression at 1. She said that she is sleeping well. She says that she has cut back on smoking and still would like to quit. She is currently using the patch but did not want to try Zyban due to possible side effects or undermining her moods. She agreed to also supplement using nicotine gum to supplement oral fixation and cravings    Review of Systems:  Review of Systems  Constitutional: Negative.   Allergic/Immunologic: Negative.   Psychiatric/Behavioral: Negative.     Medications:   Current Outpatient Medications  Medication Sig Dispense Refill  . buPROPion (ZYBAN) 150 MG 12 hr tablet Take 150 mg by mouth every morning. (Patient not taking: Reported on 03/12/2021)    . citalopram (CELEXA) 40 MG tablet Take 1 tablet (40 mg total) by mouth at bedtime. 90 tablet 3  . clonazePAM (KLONOPIN) 0.5 MG tablet Take 1 tablet (0.5 mg total) by mouth 2 (two) times daily as needed for anxiety. 60 tablet 0   No current facility-administered medications for this visit.    Medication Side Effects: None  Allergies: No Known Allergies  Past Medical History:  Diagnosis Date  . Rectal cancer ([place])    rectal ca dx 11/2009  . Rectal cancer [facility])      Past Medical History, Surgical history, Social history, and Family history were reviewed and updated as appropriate.   Please see review of systems for  further details on the patient's review from today.   Objective:   Physical Exam:  BP 101/71   Pulse 89   Ht 5\' 8"  (1.727 m)   Wt 145 lb (65.8 kg)   BMI 22.05 kg/m   Physical Exam Psychiatric:        Attention and Perception: Attention and perception normal.        Mood and Affect: Mood and affect normal.        Speech: Speech normal.        Behavior: Behavior normal. Behavior is cooperative.        Cognition and Memory: Cognition and memory normal.        Judgment: Judgment normal.     Lab Review:     Component Value Date/Time   NA 142 03/04/2015 1023   K 4.5 03/04/2015 1023   CL 103 12/26/2012 0805   CO2 25 03/04/2015 1023   GLUCOSE 93 03/04/2015 1023   GLUCOSE 98 12/26/2012 0805   BUN 17.3 03/04/2015 1023   CREATININE 0.8 03/04/2015 1023   CALCIUM 9.3 03/04/2015 1023   PROT 6.8 03/04/2015 1023   ALBUMIN 4.3 03/04/2015 1023   AST 19 03/04/2015 1023   ALT 15 03/04/2015 1023   ALKPHOS 73 03/04/2015 1023   BILITOT 0.33 03/04/2015 1023   GFRNONAA 100.22 07/10/2009 1005       Component Value Date/Time   WBC 3.3 (L) 03/04/2015 1022  WBC 5.9 12/02/2011 1550   RBC 4.29 03/04/2015 1022   RBC 4.14 12/02/2011 1550   HGB 13.3 03/04/2015 1022   HCT 39.9 03/04/2015 1022   PLT 192 03/04/2015 1022   MCV 93.0 03/04/2015 1022   MCH 31.0 03/04/2015 1022   MCH 32.9 12/02/2011 1550   MCHC 33.3 03/04/2015 1022   MCHC 36.6 (H) 12/02/2011 1550   RDW 12.1 03/04/2015 1022   LYMPHSABS 1.0 03/04/2015 1022   MONOABS 0.3 03/04/2015 1022   EOSABS 0.1 03/04/2015 1022   BASOSABS 0.0 03/04/2015 1022    No results found for: POCLITH, LITHIUM   No results found for: PHENYTOIN, PHENOBARB, VALPROATE, CBMZ   .res Assessment: Plan:    Diagnoses and all orders for this visit:  Major depressive disorder, recurrent episode, in partial remission with anxious distress (HCC) -     citalopram (CELEXA) 40 MG tablet; Take 1 tablet (40 mg total) by mouth at bedtime. -     clonazePAM  (KLONOPIN) 0.5 MG tablet; Take 1 tablet (0.5 mg total) by mouth 2 (two) times daily as needed for anxiety.   Plan:  Will continue on current medication regimen To report any changes or worsening symptoms Will continue with using nicotine patch and supplementing with gum for  Smoking cessation Will follow up in 1 year per pt request. Greater than 50% of face to face time with patient was spent on counseling and coordination of care. We discussed continued plan for smoking cessation and other medications to control cravings.  Please see After Visit Summary for patient specific instructions.  Future Appointments  Date Time Provider [place]  04/15/2021 10:30 AM McKenzie, Candee [place], MD [facility] None  03/12/2022  8:30 AM Elwanda [place], NP [facility] None    No orders of the defined types were placed in this encounter.   -------------------------------

## 2021-04-15 ENCOUNTER — Other Ambulatory Visit: Payer: 59 | Admitting: Urology

## 2021-05-29 ENCOUNTER — Other Ambulatory Visit: Payer: Self-pay | Admitting: Behavioral Health

## 2021-05-29 DIAGNOSIS — F3341 Major depressive disorder, recurrent, in partial remission: Secondary | ICD-10-CM

## 2021-05-29 NOTE — Telephone Encounter (Signed)
Last filled 4/28

## 2021-07-31 ENCOUNTER — Other Ambulatory Visit: Payer: Self-pay | Admitting: Behavioral Health

## 2021-07-31 DIAGNOSIS — F3341 Major depressive disorder, recurrent, in partial remission: Secondary | ICD-10-CM

## 2021-07-31 NOTE — Telephone Encounter (Signed)
Please send

## 2021-09-11 ENCOUNTER — Other Ambulatory Visit: Payer: Self-pay | Admitting: Behavioral Health

## 2021-09-11 DIAGNOSIS — F3341 Major depressive disorder, recurrent, in partial remission: Secondary | ICD-10-CM

## 2021-09-14 NOTE — Telephone Encounter (Signed)
Last filled 9/16 appt on 03/12/22

## 2021-10-20 ENCOUNTER — Other Ambulatory Visit: Payer: Self-pay | Admitting: Behavioral Health

## 2021-10-20 DIAGNOSIS — F3341 Major depressive disorder, recurrent, in partial remission: Secondary | ICD-10-CM

## 2021-10-20 NOTE — Telephone Encounter (Signed)
Last filled 11/1 appt on 03/12/22

## 2021-11-21 ENCOUNTER — Other Ambulatory Visit: Payer: Self-pay | Admitting: Behavioral Health

## 2021-11-21 DIAGNOSIS — F3341 Major depressive disorder, recurrent, in partial remission: Secondary | ICD-10-CM

## 2021-12-18 ENCOUNTER — Other Ambulatory Visit: Payer: Self-pay | Admitting: Behavioral Health

## 2021-12-18 DIAGNOSIS — F3341 Major depressive disorder, recurrent, in partial remission: Secondary | ICD-10-CM

## 2022-01-13 ENCOUNTER — Other Ambulatory Visit: Payer: Self-pay | Admitting: Behavioral Health

## 2022-01-13 DIAGNOSIS — F3341 Major depressive disorder, recurrent, in partial remission: Secondary | ICD-10-CM

## 2022-01-14 NOTE — Telephone Encounter (Signed)
Last filled 2/7 appt on 4/28

## 2022-02-16 ENCOUNTER — Other Ambulatory Visit: Payer: Self-pay | Admitting: Behavioral Health

## 2022-02-16 DIAGNOSIS — F3341 Major depressive disorder, recurrent, in partial remission: Secondary | ICD-10-CM

## 2022-03-12 ENCOUNTER — Ambulatory Visit (INDEPENDENT_AMBULATORY_CARE_PROVIDER_SITE_OTHER): Payer: 59 | Admitting: Behavioral Health

## 2022-03-12 DIAGNOSIS — F489 Nonpsychotic mental disorder, unspecified: Secondary | ICD-10-CM

## 2022-03-12 NOTE — Progress Notes (Signed)
Patient did not notify this office as required within 24 hours and did not show or call for missed appointment. Fees assessed ?

## 2022-03-17 ENCOUNTER — Other Ambulatory Visit: Payer: Self-pay | Admitting: Behavioral Health

## 2022-03-17 DIAGNOSIS — F3341 Major depressive disorder, recurrent, in partial remission: Secondary | ICD-10-CM

## 2022-04-07 ENCOUNTER — Encounter: Payer: Self-pay | Admitting: Behavioral Health

## 2022-04-07 ENCOUNTER — Ambulatory Visit: Payer: 59 | Admitting: Behavioral Health

## 2022-04-07 DIAGNOSIS — F3341 Major depressive disorder, recurrent, in partial remission: Secondary | ICD-10-CM | POA: Diagnosis not present

## 2022-04-07 MED ORDER — CLONAZEPAM 0.5 MG PO TABS: ORAL_TABLET | ORAL | 3 refills | Status: DC

## 2022-04-07 MED ORDER — CITALOPRAM HYDROBROMIDE 40 MG PO TABS: ORAL_TABLET | ORAL | 3 refills | Status: DC

## 2022-04-07 MED ORDER — BUPROPION HCL ER (XL) 150 MG PO TB24: 150.0000 mg | ORAL_TABLET | Freq: Every day | ORAL | 3 refills | Status: DC

## 2022-04-07 NOTE — Progress Notes (Signed)
[facility]  Patient ID: Denise Michael,  MRN: [id number]  PCP: Maurice Small, MD  Date of Evaluation: 04/07/2022 Time spent:30 minutes  Chief Complaint:   HISTORY/CURRENT STATUS: HPI  Denise Michael presents to the office today for follow-up and medication refills. She is a former patient of Dr. Creig Hines. She said that, "Im feeling very well overall but having some breakthrough depression the last couple of weeks".  She reports anxiety today at 3/10 and depression at 4/10. She said that she is sleeping well. She says that she has cut back on smoking and still would like to quit. She is currently using the patch but did not want to try Zyban due to possible side effects or undermining her moods. She would like to try Wellbutrin to assist with the depression. She denies any mania, no psychosis, no SI/HI.     Individual Medical History/ Review of Systems: Changes? :No   Allergies: Patient has no known allergies.  Current Medications:  Current Outpatient Medications:    buPROPion (WELLBUTRIN XL) 150 MG 24 hr tablet, Take 1 tablet (150 mg total) by mouth daily., Disp: 90 tablet, Rfl: 3   buPROPion (ZYBAN) 150 MG 12 hr tablet, Take 150 mg by mouth every morning. (Patient not taking: Reported on 03/12/2021), Disp: , Rfl:    citalopram (CELEXA) 40 MG tablet, TAKE 1 TABLET BY MOUTH EVERYDAY AT BEDTIME, Disp: 90 tablet, Rfl: 3   clonazePAM (KLONOPIN) 0.5 MG tablet, TAKE 1 TABLET BY MOUTH TWICE A DAY AS NEEDED FOR ANXIETY, Disp: 60 tablet, Rfl: 3 Medication Side Effects: none  Family Medical/ Social History: Changes? No  MENTAL HEALTH EXAM:  There were no vitals taken for this visit.There is no height or weight on file to calculate BMI.  General Appearance: Casual, Neat, and Well Groomed  Eye Contact:  Good  Speech:  Clear and Coherent  Volume:  Normal  Mood:  Depressed and Dysphoric  Affect:  Appropriate, Congruent, and Depressed  Thought Process:  Coherent   Orientation:  Full (Time, Place, and Person)  Thought Content: Logical   Suicidal Thoughts:  No  Homicidal Thoughts:  No  Memory:  WNL  Judgement:  Good  Insight:  Good  Psychomotor Activity:  Normal  Concentration:  Concentration: Good  Recall:  Good  Fund of Knowledge: Good  Language: Good  Assets:  Desire for Improvement  ADL's:  Intact  Cognition: WNL  Prognosis:  Good    DIAGNOSES:    ICD-10-CM   1. Major depressive disorder, recurrent episode, in partial remission with anxious distress (HCC)  F33.41 citalopram (CELEXA) 40 MG tablet    clonazePAM (KLONOPIN) 0.5 MG tablet    buPROPion (WELLBUTRIN XL) 150 MG 24 hr tablet      Receiving Psychotherapy: No    RECOMMENDATIONS:    Greater than 50% of 30 min face to face time with patient was spent on counseling and coordination of care. We discussed her current level of stability. She has been struggling with a little break through depression recently and would like to try Wellbutrin adjunctively. Pt is still trying to stop smoking. Continues to use nicotine patch.  To continue Celexa  40 mg daily To start Wellbutrin 150 mg XL daily To continue Klonopin 0.5 mg twice daily as needed for severe anxiety.  To report any changes or worsening symptoms To follow up in 8 weeks to reassess Provided emergency contact information Reviewed [place], NP

## 2022-06-02 ENCOUNTER — Encounter: Payer: Self-pay | Admitting: Behavioral Health

## 2022-06-02 ENCOUNTER — Ambulatory Visit: Payer: 59 | Admitting: Behavioral Health

## 2022-06-02 DIAGNOSIS — F3341 Major depressive disorder, recurrent, in partial remission: Secondary | ICD-10-CM

## 2022-06-02 NOTE — Progress Notes (Signed)
[facility]  Patient ID: Denise Michael,  MRN: [id number]  PCP: Maurice Small, MD  Date of Evaluation: 06/02/2022 Time spent:20 minutes  Chief Complaint:  Chief Complaint   Anxiety; Depression; Follow-up; Medication Refill     HISTORY/CURRENT STATUS: HPI  Denise Michael presents to the office today for follow-up and medication refills.  She say that the Wellbutrin has helped relieve som of her depressive symptoms but minimally have helped with smoking cravings.  She reports anxiety today at 2/10 and depression at 3/10. She said that she is sleeping well. She says that she has cut back on smoking and still would like to quit. She is currently using the patch but did not want to try Zyban due to possible side effects or undermining her moods. She would like to try Wellbutrin to assist with the depression. She denies any mania, no psychosis, no SI/HI.  Individual Medical History/ Review of Systems: Changes? :No   Allergies: Patient has no known allergies.  Current Medications:  Current Outpatient Medications:    buPROPion (WELLBUTRIN XL) 150 MG 24 hr tablet, Take 1 tablet (150 mg total) by mouth daily., Disp: 90 tablet, Rfl: 3   buPROPion (ZYBAN) 150 MG 12 hr tablet, Take 150 mg by mouth every morning. (Patient not taking: Reported on 03/12/2021), Disp: , Rfl:    citalopram (CELEXA) 40 MG tablet, TAKE 1 TABLET BY MOUTH EVERYDAY AT BEDTIME, Disp: 90 tablet, Rfl: 3   clonazePAM (KLONOPIN) 0.5 MG tablet, TAKE 1 TABLET BY MOUTH TWICE A DAY AS NEEDED FOR ANXIETY, Disp: 60 tablet, Rfl: 3 Medication Side Effects: none  Family Medical/ Social History: Changes? No  MENTAL HEALTH EXAM:  There were no vitals taken for this visit.There is no height or weight on file to calculate BMI.  General Appearance: Casual, Neat, and Well Groomed  Eye Contact:  Good  Speech:  Clear and Coherent  Volume:  Normal  Mood:  Anxious  Affect:  Appropriate  Thought Process:   Coherent  Orientation:  Full (Time, Place, and Person)  Thought Content: Logical   Suicidal Thoughts:  No  Homicidal Thoughts:  No  Memory:  WNL  Judgement:  Good  Insight:  Good  Psychomotor Activity:  Normal  Concentration:  Concentration: Good  Recall:  Good  Fund of Knowledge: Good  Language: Good  Assets:  Desire for Improvement  ADL's:  Intact  Cognition: WNL  Prognosis:  Good    DIAGNOSES:    ICD-10-CM   1. Major depressive disorder, recurrent episode, in partial remission with anxious distress (HCC)  F33.41       Receiving Psychotherapy: No    RECOMMENDATIONS:    Greater than 50% of 30 min face to face time with patient was spent on counseling and coordination of care. No changes this visit. We discussed her current level of stability. She has been struggling with a little break through depression recently and would like to try Wellbutrin adjunctively. Pt is still trying to stop smoking. Continues to use nicotine patch. Wellbutrin has minimally helped. She has appt scheduled with hypnotist.  To continue Celexa  40 mg daily To start Wellbutrin 150 mg XL daily To continue Klonopin 0.5 mg twice daily as needed for severe anxiety.  To report any changes or worsening symptoms To follow up in 3 months to reassess Provided emergency contact information Reviewed [place], NP

## 2022-08-06 ENCOUNTER — Other Ambulatory Visit: Payer: Self-pay | Admitting: Behavioral Health

## 2022-08-06 DIAGNOSIS — F3341 Major depressive disorder, recurrent, in partial remission: Secondary | ICD-10-CM

## 2022-08-06 NOTE — Telephone Encounter (Signed)
Filled 07/08/22 appt 10/18

## 2022-09-01 ENCOUNTER — Ambulatory Visit: Payer: 59 | Admitting: Behavioral Health

## 2022-09-06 ENCOUNTER — Ambulatory Visit: Payer: 59 | Admitting: Behavioral Health

## 2022-09-06 ENCOUNTER — Other Ambulatory Visit: Payer: Self-pay | Admitting: Behavioral Health

## 2022-09-06 DIAGNOSIS — F3341 Major depressive disorder, recurrent, in partial remission: Secondary | ICD-10-CM

## 2022-09-10 ENCOUNTER — Ambulatory Visit (INDEPENDENT_AMBULATORY_CARE_PROVIDER_SITE_OTHER): Payer: Self-pay | Admitting: Behavioral Health

## 2022-09-10 DIAGNOSIS — F489 Nonpsychotic mental disorder, unspecified: Secondary | ICD-10-CM

## 2022-09-10 NOTE — Progress Notes (Signed)
Patient did not show for scheduled appointment and did not provided 24 hours notice as required. Fees to be assessed.

## 2022-09-28 ENCOUNTER — Ambulatory Visit: Payer: 59 | Admitting: Behavioral Health

## 2022-09-28 ENCOUNTER — Encounter: Payer: Self-pay | Admitting: Behavioral Health

## 2022-09-28 DIAGNOSIS — F411 Generalized anxiety disorder: Secondary | ICD-10-CM | POA: Diagnosis not present

## 2022-09-28 DIAGNOSIS — F17200 Nicotine dependence, unspecified, uncomplicated: Secondary | ICD-10-CM

## 2022-09-28 DIAGNOSIS — F3341 Major depressive disorder, recurrent, in partial remission: Secondary | ICD-10-CM | POA: Diagnosis not present

## 2022-09-28 MED ORDER — CITALOPRAM HYDROBROMIDE 40 MG PO TABS: ORAL_TABLET | ORAL | 1 refills | Status: DC

## 2022-09-28 MED ORDER — HYDROXYZINE HCL 25 MG PO TABS: 25.0000 mg | ORAL_TABLET | Freq: Three times a day (TID) | ORAL | 0 refills | Status: DC | PRN

## 2022-09-28 MED ORDER — BUPROPION HCL ER (XL) 300 MG PO TB24: 300.0000 mg | ORAL_TABLET | Freq: Every day | ORAL | 3 refills | Status: DC

## 2022-09-28 MED ORDER — CLONAZEPAM 0.5 MG PO TABS: 0.5000 mg | ORAL_TABLET | Freq: Three times a day (TID) | ORAL | 0 refills | Status: DC | PRN

## 2022-09-28 NOTE — Progress Notes (Signed)
[facility]  Patient ID: Denise Michael,  MRN: [id number]  PCP: Maurice Small, MD  Date of Evaluation: 09/28/2022 Time spent:30 minutes  Chief Complaint:  Chief Complaint   Anxiety; Follow-up; Patient Education; Medication Problem     HISTORY/CURRENT STATUS: HPI Denise Michael presents to the office today for follow-up and medication refills.  She continues to say that the Wellbutrin has helped relieve some of her depressive symptoms but minimally have helped with smoking cravings. She is requesting increase. Also would like some extra Klonopin temporarily to help with anxiety while she is trying to quit smoking.  She reports anxiety today at 4/10 and depression at 2/10. She said that she is sleeping well. She says that she has cut back on smoking and feels like she is making some progress. She is currently using the patch but did not want to try Zyban due to possible side effects or undermining her moods. She would like to try Wellbutrin to assist with the depression. She denies any mania, no psychosis, no SI/HI.      Individual Medical History/ Review of Systems: Changes? :No   Allergies: Patient has no known allergies.  Current Medications:  Current Outpatient Medications:    buPROPion (WELLBUTRIN XL) 300 MG 24 hr tablet, Take 1 tablet (300 mg total) by mouth daily., Disp: 30 tablet, Rfl: 3   clonazePAM (KLONOPIN) 0.5 MG tablet, TAKE 1 TABLET BY MOUTH TWICE A DAY AS NEEDED FOR ANXIETY, Disp: 60 tablet, Rfl: 0   clonazePAM (KLONOPIN) 0.5 MG tablet, Take 1 tablet (0.5 mg total) by mouth 3 (three) times daily as needed for anxiety., Disp: 90 tablet, Rfl: 0   hydrOXYzine (ATARAX) 25 MG tablet, Take 1 tablet (25 mg total) by mouth 3 (three) times daily as needed., Disp: 30 tablet, Rfl: 0   buPROPion (WELLBUTRIN XL) 150 MG 24 hr tablet, Take 1 tablet (150 mg total) by mouth daily., Disp: 90 tablet, Rfl: 3   buPROPion (ZYBAN) 150 MG 12 hr tablet, Take 150 mg  by mouth every morning. (Patient not taking: Reported on 03/12/2021), Disp: , Rfl:    citalopram (CELEXA) 40 MG tablet, TAKE 1 TABLET BY MOUTH EVERYDAY AT BEDTIME, Disp: 90 tablet, Rfl: 1 Medication Side Effects: anxiety  Family Medical/ Social History: Changes? No  MENTAL HEALTH EXAM:  There were no vitals taken for this visit.There is no height or weight on file to calculate BMI.  General Appearance: Casual  Eye Contact:  Good  Speech:  Clear and Coherent  Volume:  Normal  Mood:  Anxious  Affect:  Congruent  Thought Process:  Coherent  Orientation:  Full (Time, Place, and Person)  Thought Content: Logical   Suicidal Thoughts:  No  Homicidal Thoughts:  No  Memory:  WNL  Judgement:  Good  Insight:  Good  Psychomotor Activity:  Normal  Concentration:  Concentration: Good  Recall:  Good  Fund of Knowledge: Good  Language: Good  Assets:  Desire for Improvement  ADL's:  Intact  Cognition: WNL  Prognosis:  Good    DIAGNOSES:    ICD-10-CM   1. Generalized anxiety disorder  F41.1 clonazePAM (KLONOPIN) 0.5 MG tablet    hydrOXYzine (ATARAX) 25 MG tablet    2. Major depressive disorder, recurrent episode, in partial remission with anxious distress (HCC)  F33.41 clonazePAM (KLONOPIN) 0.5 MG tablet    buPROPion (WELLBUTRIN XL) 300 MG 24 hr tablet    citalopram (CELEXA) 40 MG tablet    3. Smoking  F17.200  buPROPion (WELLBUTRIN XL) 300 MG 24 hr tablet      Receiving Psychotherapy: No    RECOMMENDATIONS:  Greater than 50% of 30 min face to face time with patient was spent on counseling and coordination of care. We discussed her desire and readiness to quit smoking. She was interesting in increasing her Wellbutrin and also wanted to temporarily increase her Klonopin. I agreed to for one month. We discussed her excessive caffeine intake drinking 6-7 cups per day and in the evening. Educated her on the half life of Caffeine and recommended no more than 2-3 cups and none after 2 pm.  We discussed her current level of stability. She has been struggling with a little break through depression recently and would like to try Wellbutrin adjunctively. Pt is still trying to stop smoking. Continues to use nicotine patch. Wellbutrin has minimally helped.  To continue Celexa  40 mg daily To increase Wellbutrin to 300 mg XL daily To continue Klonopin 0.5 mg three time daily as needed for severe anxiety.  To start hydroxyzine 25 mg three times daily as needed. To report any changes or worsening symptoms To follow up in 2 months to reassess Provided emergency contact information Reviewed [place], NP

## 2022-10-21 ENCOUNTER — Other Ambulatory Visit: Payer: Self-pay | Admitting: Behavioral Health

## 2022-10-21 DIAGNOSIS — F3341 Major depressive disorder, recurrent, in partial remission: Secondary | ICD-10-CM

## 2022-10-21 DIAGNOSIS — F17200 Nicotine dependence, unspecified, uncomplicated: Secondary | ICD-10-CM

## 2022-10-26 ENCOUNTER — Other Ambulatory Visit: Payer: Self-pay | Admitting: Behavioral Health

## 2022-10-26 DIAGNOSIS — F411 Generalized anxiety disorder: Secondary | ICD-10-CM

## 2022-11-06 ENCOUNTER — Ambulatory Visit (HOSPITAL_COMMUNITY)
Admission: EM | Admit: 2022-11-06 | Discharge: 2022-11-07 | Disposition: A | Discharge status: Home / Self Care | Payer: 59 | Attending: Psychiatry | Admitting: Psychiatry

## 2022-11-06 DIAGNOSIS — Z1152 Encounter for screening for COVID-19: Secondary | ICD-10-CM | POA: Diagnosis not present

## 2022-11-06 DIAGNOSIS — F17200 Nicotine dependence, unspecified, uncomplicated: Secondary | ICD-10-CM

## 2022-11-06 DIAGNOSIS — F329 Major depressive disorder, single episode, unspecified: Secondary | ICD-10-CM | POA: Diagnosis not present

## 2022-11-06 DIAGNOSIS — F419 Anxiety disorder, unspecified: Secondary | ICD-10-CM | POA: Insufficient documentation

## 2022-11-06 DIAGNOSIS — F1994 Other psychoactive substance use, unspecified with psychoactive substance-induced mood disorder: Secondary | ICD-10-CM | POA: Diagnosis present

## 2022-11-06 DIAGNOSIS — Z85048 Personal history of other malignant neoplasm of rectum, rectosigmoid junction, and anus: Secondary | ICD-10-CM | POA: Diagnosis not present

## 2022-11-06 DIAGNOSIS — F17210 Nicotine dependence, cigarettes, uncomplicated: Secondary | ICD-10-CM | POA: Diagnosis not present

## 2022-11-06 DIAGNOSIS — F1320 Sedative, hypnotic or anxiolytic dependence, uncomplicated: Secondary | ICD-10-CM

## 2022-11-06 LAB — URINALYSIS, ROUTINE W REFLEX MICROSCOPIC
Bilirubin Urine: NEGATIVE
Glucose, UA: NEGATIVE mg/dL
Ketones, ur: NEGATIVE mg/dL
Leukocytes,Ua: NEGATIVE
Nitrite: NEGATIVE
Protein, ur: NEGATIVE mg/dL
Specific Gravity, Urine: 1.012 (ref 1.005–1.030)
pH: 6 (ref 5.0–8.0)

## 2022-11-06 LAB — TSH: TSH: 0.412 u[IU]/mL (ref 0.350–4.500)

## 2022-11-06 LAB — POCT URINE DRUG SCREEN - MANUAL ENTRY (I-SCREEN)
POC Amphetamine UR: NOT DETECTED
POC Buprenorphine (BUP): NOT DETECTED
POC Cocaine UR: NOT DETECTED
POC Marijuana UR: POSITIVE — AB
POC Methadone UR: NOT DETECTED
POC Methamphetamine UR: NOT DETECTED
POC Morphine: NOT DETECTED
POC Oxazepam (BZO): NOT DETECTED
POC Oxycodone UR: NOT DETECTED
POC Secobarbital (BAR): NOT DETECTED

## 2022-11-06 LAB — COMPREHENSIVE METABOLIC PANEL
ALT: 15 U/L (ref 0–44)
AST: 24 U/L (ref 15–41)
Albumin: 4.4 g/dL (ref 3.5–5.0)
Alkaline Phosphatase: 71 U/L (ref 38–126)
Anion gap: 11 (ref 5–15)
BUN: 6 mg/dL (ref 6–20)
CO2: 24 mmol/L (ref 22–32)
Calcium: 9.5 mg/dL (ref 8.9–10.3)
Chloride: 102 mmol/L (ref 98–111)
Creatinine, Ser: 0.79 mg/dL (ref 0.44–1.00)
GFR, Estimated: >60 mL/min (ref >60)
Glucose, Bld: 124 mg/dL — ABNORMAL HIGH (ref 70–99)
Potassium: 3.8 mmol/L (ref 3.5–5.1)
Sodium: 137 mmol/L (ref 135–145)
Total Bilirubin: 0.6 mg/dL (ref 0.3–1.2)
Total Protein: 6.8 g/dL (ref 6.5–8.1)

## 2022-11-06 LAB — RESP PANEL BY RT-PCR (RSV, FLU A&B, COVID)  RVPGX2
Influenza A by PCR: NEGATIVE
Influenza B by PCR: NEGATIVE
Resp Syncytial Virus by PCR: NEGATIVE
SARS Coronavirus 2 by RT PCR: NEGATIVE

## 2022-11-06 LAB — LIPID PANEL
Cholesterol: 235 mg/dL — ABNORMAL HIGH (ref 0–200)
HDL: 84 mg/dL (ref >40)
LDL Cholesterol: 134 mg/dL — ABNORMAL HIGH (ref 0–99)
Total CHOL/HDL Ratio: 2.8 RATIO
Triglycerides: 86 mg/dL (ref <150)
VLDL: 17 mg/dL (ref 0–40)

## 2022-11-06 LAB — ETHANOL: Alcohol, Ethyl (B): <10 mg/dL (ref <10)

## 2022-11-06 LAB — CBC WITH DIFFERENTIAL/PLATELET
Abs Immature Granulocytes: 0.01 10*3/uL (ref 0.00–0.07)
Basophils Absolute: 0 10*3/uL (ref 0.0–0.1)
Basophils Relative: 1 %
Eosinophils Absolute: 0 10*3/uL (ref 0.0–0.5)
Eosinophils Relative: 1 %
HCT: 43.6 % (ref 36.0–46.0)
Hemoglobin: 14.6 g/dL (ref 12.0–15.0)
Immature Granulocytes: 0 %
Lymphocytes Relative: 16 %
Lymphs Abs: 0.8 10*3/uL (ref 0.7–4.0)
MCH: 31.7 pg (ref 26.0–34.0)
MCHC: 33.5 g/dL (ref 30.0–36.0)
MCV: 94.6 fL (ref 80.0–100.0)
Monocytes Absolute: 0.4 10*3/uL (ref 0.1–1.0)
Monocytes Relative: 7 %
Neutro Abs: 3.9 10*3/uL (ref 1.7–7.7)
Neutrophils Relative %: 75 %
Platelets: 328 10*3/uL (ref 150–400)
RBC: 4.61 MIL/uL (ref 3.87–5.11)
RDW: 11.6 % (ref 11.5–15.5)
WBC: 5.2 10*3/uL (ref 4.0–10.5)
nRBC: 0 % (ref 0.0–0.2)

## 2022-11-06 LAB — POC URINE PREG, ED

## 2022-11-06 MED ORDER — TRAZODONE HCL 50 MG PO TABS: 50.0000 mg | ORAL_TABLET | Freq: Every evening | ORAL | Status: DC | PRN

## 2022-11-06 MED ORDER — HYDROXYZINE HCL 25 MG PO TABS
25.0000 mg | ORAL_TABLET | Freq: Three times a day (TID) | ORAL | Status: DC | PRN
  Administered 2022-11-06: 25 mg via ORAL
  Filled 2022-11-06: qty 1

## 2022-11-06 MED ORDER — OLANZAPINE 5 MG PO TABS: 5.0000 mg | ORAL_TABLET | Freq: Every day | ORAL | Status: DC

## 2022-11-06 MED ORDER — NICOTINE 21 MG/24HR TD PT24
21.0000 mg | MEDICATED_PATCH | Freq: Every day | TRANSDERMAL | Status: DC
  Administered 2022-11-07: 21 mg via TRANSDERMAL
  Filled 2022-11-06: qty 1

## 2022-11-06 MED ORDER — ACETAMINOPHEN 325 MG PO TABS: 650.0000 mg | ORAL_TABLET | Freq: Four times a day (QID) | ORAL | Status: DC | PRN

## 2022-11-06 MED ORDER — MAGNESIUM HYDROXIDE 400 MG/5ML PO SUSP: 30.0000 mL | Freq: Every day | ORAL | Status: DC | PRN

## 2022-11-06 MED ORDER — ALUM & MAG HYDROXIDE-SIMETH 200-200-20 MG/5ML PO SUSP: 30.0000 mL | ORAL | Status: DC | PRN

## 2022-11-06 MED ORDER — OLANZAPINE 5 MG PO TABS
5.0000 mg | ORAL_TABLET | Freq: Once | ORAL | Status: AC
  Administered 2022-11-06: 5 mg via ORAL
  Filled 2022-11-06: qty 1

## 2022-11-06 NOTE — ED Notes (Addendum)
Patient was admitted to the obs unit. Patient denied SI/HI and AVH. Patient reports she has racing thoughts. Patient was given Zyprexa and Atarax. Patient is resting and being monitoring for safety.

## 2022-11-06 NOTE — Progress Notes (Signed)
   11/06/22 0929  [place] (Walk-ins at [facility] only)  How Did You Hear About [place]? Family/Friend  What Is the Reason for Your Visit/Call Today? Pt. said that her family was concerned due to her thoughts being more disorganized than ususal for the past few days and felt they she may neeed to be checked out.  How Long Has This Been Causing You Problems? <Week  Have You Recently Had Any Thoughts About Hurting Yourself? No  Are You Planning to Commit Suicide/Harm Yourself At This time? No  Have you Recently Had Thoughts About [place]? No  Are You Planning To Harm Someone At This Time? No  Have You Used Any Alcohol or Drugs in the Past 24 Hours? No  Do you have any current medical co-morbidities that require immediate attention? No  Clinician description of patient physical appearance/behavior: Pt. was well groomed, made good eye contact and engaged well  What Do You Feel Would Help You the Most Today? Treatment for Depression or other mood problem  If access to [facility] was not available, would you have sought care in the Emergency Department? No  Determination of Need Routine (7 days)  Options For Referral Medication Management;Outpatient Therapy

## 2022-11-06 NOTE — BH Assessment (Signed)
Pt. Is routine.  Pt. Denied having SI/HI/AVH. Family is concerned that she may be having a manic episode as she has been unable to slow her thoughts.

## 2022-11-06 NOTE — ED Notes (Signed)
Pt sleeping@this time. Breathing even and unlabored. Will continue to monitor for safety 

## 2022-11-06 NOTE — BH Assessment (Signed)
Comprehensive Clinical Assessment (CCA) Note  11/06/2022 Denise Michael [phone] [place] MD recommended patient to continuous assessment.   [place] ED from 11/06/2022 in [place] No Risk        Patient is a 51 year old female that presents this date to [facility] voluntary as a walk in brought in by her sister Denise Michael) with concerns over disorganized thinking and mania over the last two days. Patient denies any S/I, H/I or AVH at the time of triage. Patient has a history significant for MDD/GAD and currently receives OP services from [facility] who assists with medication management. Patient reports current medication compliance and denies any recent medication changes (See MAR). Patient's speech is pressured and circumstantial although is redirectable. Patient denies any current SA issues. Patient currently receives medication management for symptoms prescribed by [name] NP (See [facility]). Patient rendered limited history to this Probation officer and stated she "was very nervous talking about all this and will wait for the doctor." Information to complete assessment was obtained from provider's note below.     Patient was evaluated by Deveron Furlong MD who writes this date:  Denise Michael is a 51 yo female with a documented history of MDD w/ anxious distress, tobacco use disorder, and  rectal adenocarcinoma who presents to [facility] accompanied by sister for concern for increased energy, pressured speech, and disorganized thoughts for the past 4-5 days.  Patient reports she experiences racing thoughts at baseline, but is usually able to control herself. Recently she describes her thoughts as chaotic, "a fire hose of information" and also describes her thought process as "chaotic". During this time she also endorses decreased need for sleep, sleeping 5 hours on average, as her thoughts keep her awake and will drive for hours during the  night (patient reports she does at baseline, but sister Denise Michael confirms she does not do). She does not specifically endorse any changes in mood or irritability.  She denies any grandiosity or impulsivity related to hypersexuality, gambling, or over-spending. Patient reports her thoughts keep her from focusing on a single task, which has made it difficult to work recently.  She also describes a cycle of "go go go and crash", which she explains are a series of week long periods of increased energy and productivity, followed by a "crash" that she attributes to her history of dysthymia.   On assessment, patient denies feeling anxious, but endorses body tension and difficulty staying still. She denies any history of panic attacks.    Patient reports taking Celexa for her depression. She also reports taking Wellbutrin for smoking cessation, and her dose was recently increased ~2 weeks ago from 150 mg to 300 mg on 10/21/2022.  She was also prescribed Klonopin, which she reports abusing and ran out of her prescription a week ago.  States that her outpatient psychiatrist is currently trying to wean her off benzodiazepines due to this.  She otherwise reports being adherent to her antidepressant medications, rarely missing doses.  She denies purchasing or consuming any illicit substances in the past or presently. She reports smoking 1 ppd of cigarettes, has attempted to cut down in the past with no success, does not feel the Wellbutrin has made a difference. She also reports drinking 4 to 5 cups of coffee daily.    On assessment, patient denies any suicidal ideation, contracts for safety on the unit. Denies any access to firearms in her home. She denies any depressive or anhedonic symptoms.  She  denies any homicidal ideation. She denies auditory and visual hallucinations.  There are no apparent paranoid ideations. There are no apparent delusional thought processes.    Visit Diagnosis: MDD recurrent without psychotic  features, severe, GAD     CCA Screening, Triage and Referral (STR)  Patient Reported Information How did you hear about [place]? Self  What Is the Reason for Your Visit/Call Today? Patient is a 51 year old female that presents this date to [facility] voluntary as a walk in brought in by her sister Denise Michael) with concerns over disorganized thinking and mania over the last two days. Patient denies any S/I, H/I or AVH at the time of triage. Patient has a history significant for MDD/GAD and currently receives OP services from [facility] who assists with medication management. Patient reports current medication compliance and denies any recent medication changes (See MAR). Patient's speech is pressured and circumstantial although is redirectable. Patient denies any current SA issues.  How Long Has This Been Causing You Problems? <Week  What Do You Feel Would Help You the Most Today? Treatment for Depression or other mood problem; Stress Management   Have You Recently Had Any Thoughts About Hurting Yourself? No  Are You Planning to Commit Suicide/Harm Yourself At This time? No   Flowsheet Row ED from 11/06/2022 in [place] No Risk       Have you Recently Had Thoughts About [place]? No  Are You Planning to Harm Someone at This Time? No  Explanation: NA   Have You Used Any Alcohol or Drugs in the Past 24 Hours? No  What Did You Use and How Much? NA   Do You Currently Have a Therapist/Psychiatrist? Yes  Name of Therapist/Psychiatrist: Name of Therapist/Psychiatrist: OP provider [name] NP [facility]   Have You Been Recently Discharged From Any Office Practice or Programs? No  Explanation of Discharge From Practice/Program: NA     CCA Screening Triage Referral Assessment Type of Contact: Face-to-Face  Telemedicine Service Delivery:   Is this Initial or Reassessment?   Date Telepsych consult ordered in [facility]:    Time  Telepsych consult ordered in [facility]:    Location of Assessment: [facility] Assessment Services  Provider Location: [facility] Assessment Services   Collateral Involvement: Sister Denise Michael) who is present   Does Patient Have a Stage manager Guardian? No  Legal Guardian Contact Information: NA  Copy of Legal Guardianship Form: -- (NA)  Legal Guardian Notified of Arrival: -- (NA)  Legal Guardian Notified of Pending Discharge: -- (NA)  If Minor and Not Living with Parent(s), Who has Custody? NA  Is CPS involved or ever been involved? Never  Is APS involved or ever been involved? Never   Patient Determined To Be At Risk for Harm To Self or Others Based on Review of Patient Reported Information or Presenting Complaint? No  Method: -- (NA)  Availability of Means: -- (NA)  Intent: -- (NA)  Notification Required: -- (NA)  Additional Information for Danger to Others Potential: -- (NA)  Additional Comments for Danger to Others Potential: NA  Are There Guns or Other Weapons in Your Home? No  Types of Guns/Weapons: NA  Are These Weapons Safely Secured?                            -- (NA)  Who Could Verify You Are Able To Have These Secured: NA  Do You Have  any Outstanding Charges, Pending Court Dates, Parole/Probation? None  Contacted To Inform of Risk of Harm To Self or Others: -- (NA)    Does Patient Present under Involuntary Commitment? No    [place] of Residence: [place]   Patient Currently Receiving the Following Services: Medication Management   Determination of Need: Urgent (48 hours)   Options For Referral: Other: Comment (Continuious Observation)     CCA Biopsychosocial Patient Reported Schizophrenia/Schizoaffective Diagnosis in Past: No   Strengths: Patient is willing to participate in treatment and is open to interventions including possible medication adjustments and OP counseling   Mental Health Symptoms Depression:   Change in  energy/activity   Duration of Depressive symptoms:  Duration of Depressive Symptoms: Less than two weeks   Mania:   Change in energy/activity; Racing thoughts; Irritability   Anxiety:    Difficulty concentrating; Restlessness; Irritability   Psychosis:   None   Duration of Psychotic symptoms:    Trauma:   None   Obsessions:   None   Compulsions:   None   Inattention:   None   Hyperactivity/Impulsivity:   Always on the go   Oppositional/Defiant Behaviors:   None   Emotional Irregularity:   None   Other Mood/Personality Symptoms:   Above symptoms have intensified over the last two to three days    Mental Status Exam Appearance and self-care  Stature:   Average   Weight:   Average weight   Clothing:   Neat/clean   Grooming:   Normal   Cosmetic use:   None   Posture/gait:   Normal   Motor activity:   Restless; Repetitive   Sensorium  Attention:   Distractible   Concentration:   Anxiety interferes   Orientation:   X5   Recall/memory:   Normal   Affect and Mood  Affect:   Anxious   Mood:   Anxious   Relating  Eye contact:   Fleeting   Facial expression:   Anxious   Attitude toward examiner:   Cooperative   Thought and Language  Speech flow:  Pressured   Thought content:   Appropriate to Mood and Circumstances   Preoccupation:   None   Hallucinations:   None   Organization:   Coherent   [employer] of Knowledge:   Fair   Intelligence:   Average   Abstraction:   Normal   Judgement:   Fair   Art therapist:   Realistic   Insight:   Fair   Decision Making:   Normal   Social Functioning  Social Maturity:   Responsible   Social Judgement:   Normal   Stress  Stressors:   Other (Comment) (None noted)   Coping Ability:   Overwhelmed   Skill Deficits:   Activities of daily living   Supports:   Family     Religion: Religion/Spirituality Are You A Religious Person?:  No How Might This Affect Treatment?: Pt states they are more spirtual  Leisure/Recreation: Leisure / Recreation Do You Have Hobbies?: No  Exercise/Diet: Exercise/Diet Do You Exercise?: No Have You Gained or Lost A Significant Amount of Weight in the Past Six Months?: No Do You Follow a Special Diet?: No Do You Have Any Trouble Sleeping?: Yes Explanation of Sleeping Difficulties: Pt states they have only been sleeping 2 to 3 hours a night for the last week   CCA Employment/Education Employment/Work Situation: Employment / Work Situation Employment Situation: Employed Work Stressors: None noted Patient's Job has Been  Impacted by Current Illness: Yes Describe how Patient's Job has Been Impacted: Due to racing thoughts and anxiety patient reports they recently have missed a few work days Has Patient ever Been in the [employer]?: No  Education: Education Is Patient Currently Attending School?: No Last Grade Completed: 12 Did You Attend College?: Yes What Type of College Degree Do you Have?: Bachelors degree Did You Have An Individualized Education Program (IIEP): No Did You Have Any Difficulty At School?: No Patient's Education Has Been Impacted by Current Illness: No   CCA Family/Childhood History Family and Relationship History: Family history Marital status: Single Does patient have children?: No  Childhood History:  Childhood History By whom was/is the patient raised?: Both parents Did patient suffer any verbal/emotional/physical/sexual abuse as a child?: No Did patient suffer from severe childhood neglect?: No Has patient ever been sexually abused/assaulted/raped as an adolescent or adult?: No Was the patient ever a victim of a crime or a disaster?: No Witnessed domestic violence?: No Has patient been affected by domestic violence as an adult?: No       CCA Substance Use Alcohol/Drug Use: Alcohol / Drug Use History of alcohol / drug use?: No history of alcohol  / drug abuse                         ASAM's:  Six Dimensions of Multidimensional Assessment  Dimension 1:  Acute Intoxication and/or Withdrawal Potential:      Dimension 2:  Biomedical Conditions and Complications:      Dimension 3:  Emotional, Behavioral, or Cognitive Conditions and Complications:     Dimension 4:  Readiness to Change:     Dimension 5:  Relapse, Continued use, or Continued Problem Potential:     Dimension 6:  Recovery/Living Environment:     ASAM Severity Score:    ASAM Recommended Level of Treatment:     Substance use Disorder (SUD)    Recommendations for Services/Supports/Treatments:    Discharge Disposition:    DSM5 Diagnoses: Patient Active Problem List   Diagnosis Date Noted   Major depressive disorder, recurrent episode, in partial remission with anxious distress ([place]) 10/25/2018   Tobacco use disorder, moderate, dependence 08/23/2011   Viral URI 08/23/2011   ADENOCARCINOMA, RECTUM 12/12/2009   IBS 07/10/2009   DYSFUNCTIONAL UTERINE BLEEDING 07/10/2009   FATIGUE, ACUTE 07/10/2009   MIGRAINE HEADACHE 06/16/2009     Referrals to Alternative Service(s): Referred to Alternative Service(s):   Place:   Date:   Time:    Referred to Alternative Service(s):   Place:   Date:   Time:    Referred to Alternative Service(s):   Place:   Date:   Time:    Referred to Alternative Service(s):   Place:   Date:   Time:     Mamie Nick, LCAS

## 2022-11-06 NOTE — ED Provider Notes (Cosign Needed Addendum)
[facility]gent [facility] Continuous Assessment Admission H&P  Date: 11/06/22 Patient Name: Denise Michael MRN: [id number] Chief Complaint: No chief complaint on file.     Diagnoses:  Final diagnoses:  None    HPI:  Denise Michael is a 51 yo female with a documented history of MDD w/ anxious distress, tobacco use disorder, and  rectal adenocarcinoma who presents to [facility] accompanied by sister for concern for increased energy, pressured speech, and disorganized thoughts for the past 4-5 days.  Patient reports she experiences racing thoughts at baseline, but is usually able to control herself. Recently she describes her thoughts as chaotic, "a fire hose of information" and also describes her thought process as "chaotic". During this time she also endorses decreased need for sleep, sleeping 5 hours on average, as her thoughts keep her awake and will drive for hours during the night (patient reports she does at baseline, but sister Nevin Bloodgood confirms she does not do). She does not specifically endorse any changes in mood or irritability.  She denies any grandiosity or impulsivity related to hypersexuality, gambling, or over-spending. Patient reports her thoughts keep her from focusing on a single task, which has made it difficult to work recently.  She also describes a cycle of "go go go and crash", which she explains are a series of week long periods of increased energy and productivity, followed by a "crash" that she attributes to her history of dysthymia.  On assessment, patient denies feeling anxious, but endorses body tension and difficulty staying still. She denies any history of panic attacks.   Patient reports taking Celexa for her depression. She also reports taking Wellbutrin for smoking cessation, and her dose was recently increased ~2 weeks ago from 150 mg to 300 mg on 10/21/2022.  She was also prescribed Klonopin, which she reports abusing and ran out of her prescription a week ago.  States  that her outpatient psychiatrist is currently trying to wean her off benzodiazepines due to this.  She otherwise reports being adherent to her antidepressant medications, rarely missing doses.  She denies purchasing or consuming any illicit substances in the past or presently. She reports smoking 1 ppd of cigarettes, has attempted to cut down in the past with no success, does not feel the Wellbutrin has made a difference. She also reports drinking 4 to 5 cups of coffee daily.   On assessment, patient denies any suicidal ideation, contracts for safety on the unit. Denies any access to firearms in her home.  She denies any depressive or anhedonic symptoms.  She denies any homicidal ideation.  She denies auditory and visual hallucinations.  There are no apparent paranoid ideations.  There are no apparent delusional thought processes.   PHQ 2-9:   [facility] from 11/06/2022 in [place] No Risk        Total Time spent with patient: 45 minutes  Musculoskeletal  Strength & Muscle Tone: within normal limits Gait & Station: normal Patient leans: N/A  Psychiatric Specialty Exam  Presentation General Appearance: Disheveled  Eye Contact:Good  Speech:Pressured  Speech Volume:Normal  Handedness:Right   Mood and Affect  Mood:-- ("I feel fine")  Affect:Non-Congruent; Full Range   Thought Process  Thought Processes:Disorganized  Descriptions of Associations:Circumstantial  Orientation:Full (Time, Place and Person)  Thought Content:Scattered    Hallucinations:Hallucinations: None  Ideas of Reference:None  Suicidal Thoughts:Suicidal Thoughts: No  Homicidal Thoughts:Homicidal Thoughts: No   Sensorium  Memory:Immediate Good; Recent Good; Remote Fair  Judgment:Impaired  Insight:Present   Executive Functions  Concentration:Poor  Attention Span:Poor  [place]  Language:Good   Psychomotor Activity  Psychomotor Activity:Psychomotor Activity: Restlessness; Psychomotor Retardation; Tremor   Assets  Assets:Desire for Improvement; Communication Skills; Housing; Talents/Skills; Social Support   Sleep  Sleep:Sleep: Poor Number of Hours of Sleep: 5   Nutritional Assessment (For OBS and FBC admissions only) Has the patient had a weight loss or gain of 10 pounds or more in the last 3 months?: No Has the patient had a decrease in food intake/or appetite?: No Does the patient have dental problems?: No Does the patient have eating habits or behaviors that may be indicators of an eating disorder including binging or inducing vomiting?: No Has the patient recently lost weight without trying?: 0 Has the patient been eating poorly because of a decreased appetite?: 1 Malnutrition Screening Tool Score: 1    Physical Exam Constitutional:      General: She is not in acute distress.    Appearance: She is not ill-appearing.  HENT:     Head: Normocephalic and atraumatic.  Pulmonary:     Effort: Pulmonary effort is normal. No respiratory distress.  Musculoskeletal:        General: Normal range of motion.  Neurological:     General: No focal deficit present.     Mental Status: She is alert.  Psychiatric:        Attention and Perception: She does not perceive auditory or visual hallucinations.        Mood and Affect: Mood is anxious.        Speech: Speech is rapid and pressured.        Behavior: Behavior is not agitated or aggressive.        Thought Content: Thought content is not paranoid. Thought content does not include homicidal or suicidal ideation.        Judgment: Judgment is not impulsive or inappropriate.    Review of Systems  Cardiovascular:  Negative for chest pain and palpitations.    Blood pressure (!) 132/97, pulse (!) 106, temperature 98.4 F (36.9 C), temperature source Oral, resp. rate 18, SpO2 97 %. There is no  height or weight on file to calculate BMI.  Past Psychiatric History:  Patient denies any past psychiatric hospitalizations or rehab. She reports she participated in any psychotherapy sessions was over 10 years ago. She denies any history of suicide.  Current Outpatient Provider: [facility]  Is the patient at risk to self? Yes  Has the patient been a risk to self in the past 6 months? No .    Has the patient been a risk to self within the distant past? No   Is the patient a risk to others? No   Has the patient been a risk to others in the past 6 months? No   Has the patient been a risk to others within the distant past? No   Past Medical History:  Past Medical History:  Diagnosis Date   Rectal cancer ([place])    rectal ca dx 11/2009   Rectal cancer [facility])     Past Surgical History:  Procedure Laterality Date   RECTAL SURGERY  03/2010   TO REMOVE CANCEROUS TUMOR IN RECTUM AND ESTAB TEMPORARY ILIEOSTOMY   REVERSE ILIEOSTOMY  10/2010    Family History:  Family History  Problem Relation Age of Onset   Graves' disease Mother    Hypertension Father    Heart disease  Maternal Grandmother    Cancer Maternal Grandfather        BLADDER/PROSTATE   Breast cancer Paternal Grandmother     Social History:  Social History   Socioeconomic History   Marital status: Single    Spouse name: Not on file   Number of children: Not on file   Years of education: Not on file   Highest education level: Not on file  Occupational History   Not on file  Tobacco Use   Smoking status: Some Days    Packs/day: 0.50    Types: Cigarettes   Smokeless tobacco: Never  Vaping Use   Vaping Use: Never used  Substance and Sexual Activity   Alcohol use: Yes    Comment: Rarely   Drug use: No   Sexual activity: Not Currently    Partners: Male    Birth control/protection: None  Other Topics Concern   Not on file  Social History Narrative   Not on file   Social Determinants of Health    Financial Resource Strain: Not on file  Food Insecurity: Not on file  Transportation Needs: Not on file  Physical Activity: Not on file  Stress: Not on file  Social Connections: Not on file  Intimate Partner Violence: Not on file    SDOH:  SDOH Screenings   Tobacco Use: High Risk (09/28/2022)    Last Labs:  No visits with results within 6 Month(s) from this visit.  Latest known visit with results is:  Appointment on 03/04/2015  Component Date Value Ref Range Status   WBC 03/04/2015 3.3 (L)  3.9 - 10.3 10e3/uL Final   NEUT# 03/04/2015 1.9  1.5 - 6.5 10e3/uL Final   HGB 03/04/2015 13.3  11.6 - 15.9 g/dL Final   HCT 03/04/2015 39.9  34.8 - 46.6 % Final   Platelets 03/04/2015 192  145 - 400 10e3/uL Final   MCV 03/04/2015 93.0  79.5 - 101.0 fL Final   MCH 03/04/2015 31.0  25.1 - 34.0 pg Final   MCHC 03/04/2015 33.3  31.5 - 36.0 g/dL Final   RBC 03/04/2015 4.29  3.70 - 5.45 10e6/uL Final   RDW 03/04/2015 12.1  11.2 - 14.5 % Final   lymph# 03/04/2015 1.0  0.9 - 3.3 10e3/uL Final   MONO# 03/04/2015 0.3  0.1 - 0.9 10e3/uL Final   Eosinophils Absolute 03/04/2015 0.1  0.0 - 0.5 10e3/uL Final   Basophils Absolute 03/04/2015 0.0  0.0 - 0.1 10e3/uL Final   NEUT% 03/04/2015 57.4  38.4 - 76.8 % Final   LYMPH% 03/04/2015 28.7  14.0 - 49.7 % Final   MONO% 03/04/2015 9.4  0.0 - 14.0 % Final   EOS% 03/04/2015 3.6  0.0 - 7.0 % Final   BASO% 03/04/2015 0.9  0.0 - 2.0 % Final   Sodium 03/04/2015 142  136 - 145 mEq/L Final   Potassium 03/04/2015 4.5  3.5 - 5.1 mEq/L Final   Chloride 03/04/2015 106  98 - 109 mEq/L Final   CO2 03/04/2015 25  22 - 29 mEq/L Final   Glucose 03/04/2015 93  70 - 140 mg/dl Final   BUN 03/04/2015 17.3  7.0 - 26.0 mg/dL Final   Creatinine 03/04/2015 0.8  0.6 - 1.1 mg/dL Final   Total Bilirubin 03/04/2015 0.33  0.20 - 1.20 mg/dL Final   Alkaline Phosphatase 03/04/2015 73  40 - 150 U/L Final   AST 03/04/2015 19  5 - 34 U/L Final   ALT 03/04/2015 15  0 - 55  U/L Final    Total Protein 03/04/2015 6.8  6.4 - 8.3 g/dL Final   Albumin 03/04/2015 4.3  3.5 - 5.0 g/dL Final   Calcium 03/04/2015 9.3  8.4 - 10.4 mg/dL Final   Anion Gap 03/04/2015 11  3 - 11 mEq/L Final   EGFR 03/04/2015 >90  >90 ml/min/1.73 m2 Final   eGFR is calculated using the CKD-EPI Creatinine Equation (2009)   CEA 03/04/2015 1.4  0.0 - 5.0 ng/mL Final    Allergies: Patient has no known allergies.  PTA Medications: (Not in a hospital admission)   Medical Decision Making  Bennetta A. Yutzy is a 51 yo female with a documented history of MDD w/ anxious distress, tobacco use disorder, and  rectal adenocarcinoma who presents to [facility] for  increased energy, pressured speech, and disorganized thoughts for the past 4-5 days in the setting of recent changes to psychiatric medications and benzodiazepine abuse. Per discussion with patient's sister, Nevin Bloodgood, patient is far from baseline, with no previous hx of manic-like symptoms or hospitalizations related to such. On exam patient appears agitated, restless, and with an appreciable tremor.  Pressured speech is evident, although she is redirectable she is circumstantial with her associations. There are no apparent delusions or paranoia appreciated. There is also no previous history of manic symptoms per chart review.  Based on presentation and history, I suspect her manic symptoms may be medication induced, as her Wellbutrin was recently increased. There may also be a component of withdrawal, as patient has admitted to abusing klonopin recently and ran out of her prescriptions a week ago.    Recommendations  Based on my evaluation the patient does not appear to have an emergency medical condition. Patient will remain on the unit for overnight observation and will be reassessed for disposition tomorrow.   Labs ordered and reviewed: -Respiratory panel -pending -UA -pending -UDS -pending -Ethanol - pending -CMP -pending -CBC -pending -TSH  -pending -Urine pregnancy test -pending   We will not continue her home medications at this time. Holding Celexa 40 mg and Wellbutrin 300 mg.   The following medications were ordered: - Zyprexa 5 mg once for acute mania, followed by Zyprexa 5 mg QHS - Atarax 25 mg Q6HR PRN for anxiety  - Trazodone 50 mg QHS for sleep - Nicoderm patch 21 mg for tobacco cessation/nicotine cravings - Tylenol 650 mg Q6HR PRN for mild pain   Christene Slates, MD 11/06/22  12:52 PM

## 2022-11-07 MED ORDER — OLANZAPINE 5 MG PO TABS: 5.0000 mg | ORAL_TABLET | Freq: Every day | ORAL | 0 refills | Status: DC

## 2022-11-07 MED ORDER — NICOTINE 21 MG/24HR TD PT24: 21.0000 mg | MEDICATED_PATCH | Freq: Every day | TRANSDERMAL | 0 refills | Status: AC

## 2022-11-07 MED ORDER — TRAZODONE HCL 50 MG PO TABS: 50.0000 mg | ORAL_TABLET | Freq: Every evening | ORAL | 0 refills | Status: DC | PRN

## 2022-11-07 NOTE — ED Notes (Signed)
Patient denies SI/HI and AVH. Patient reported she slept well this morning and would like to go home to enjoy the holidays with her family. Patient remains fidgety. Patient is cooperative. Patient is being monitored for safety.

## 2022-11-07 NOTE — Discharge Instructions (Addendum)
Prescriptions given at discharge. Patient agreeable to plan.  Given opportunity to ask questions.  Appears to feel comfortable with discharge denies any current suicidal or homicidal thought. Patient is also instructed prior to discharge to: Take all medications as prescribed by his/her mental healthcare provider. Report any adverse effects and or reactions from the medicines to his/her outpatient provider promptly. Patient has been instructed & cautioned: To not engage in alcohol and or illegal drug use while on prescription medicines. In the event of worsening symptoms, patient is instructed to call the crisis hotline, 911 and or go to the nearest ED for appropriate evaluation and treatment of symptoms. To follow-up with his/her primary care provider for your other medical issues, concerns and or health care needs.

## 2022-11-07 NOTE — ED Notes (Signed)
Pt sleeping@this time. Breathing even and unlabored. Will continue to monitor for safety 

## 2022-11-07 NOTE — ED Provider Notes (Signed)
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 Patient also admits to driving for hours during the night and poor sleep hygiene within that timeline.  Stay Summary:  During the patient's overnight observation patient had extensive initial psychiatric evaluation, and follow-up psychiatric evaluations every day.  Psychiatric diagnoses provided upon initial assessment:  Medication or substance induced bipolar episode Tobacco use disorder Moderate benzodiazepine use disorder  Patient's psychiatric medications were adjusted on admission:  - Home medications held: Celexa 40 mg daily and Wellbutrin 300 mg daily  - Zyprexa 5 mg once for acute mania - Atarax 25 mg Q6HR PRN for anxiety  - Trazodone 50 mg QHS for sleep - Nicoderm patch 21 mg for tobacco cessation/nicotine cravings - Tylenol 650 mg Q6HR PRN for mild pain  Patient's care was discussed during the interdisciplinary team meeting during the overnight observation.  The patient denies having side effects to prescribed psychiatric medication.  Gradually, patient started adjusting to milieu. The patient was evaluated each day by a clinical provider to ascertain response to treatment. Improvement was noted by the patient's report of decreasing symptoms, improved sleep and appetite, affect, medication tolerance, behavior, and participation in unit programming.  Patient was asked each day to complete  a self inventory noting mood, mental status, pain, new symptoms, anxiety and concerns.    Symptoms were reported as significantly decreased or resolved completely by discharge.   On day of discharge, the patient reports that their mood is stable. The patient denied having suicidal thoughts for more than 48 hours prior to discharge.  Patient denies having homicidal thoughts.  Patient denies having auditory hallucinations.  Patient denies any visual hallucinations or other symptoms of psychosis. The patient was motivated to continue taking medication with a goal of continued improvement in mental health.   The patient reports their target psychiatric symptoms of mania, including pressured speech, flight of ideas, increased energy, and impulsivity responded well to the psychiatric medications.  Labs were reviewed with the patient, and abnormal results were discussed with the patient.  The patient is able to verbalize their individual safety plan to this provider.  # It is recommended to the patient to continue psychiatric medications as prescribed, after discharge from the hospital.    # It is recommended to the patient to follow up with your outpatient psychiatric provider and PCP.  # It was discussed with the patient, the impact of alcohol, drugs, tobacco have been there overall psychiatric and medical wellbeing, and total abstinence from substance use was recommended the patient.ed.  # Prescriptions provided or sent directly to preferred pharmacy at discharge. Patient agreeable to plan. Given opportunity to ask questions. Appears to feel comfortable with discharge.    # In the event of worsening symptoms, the patient is instructed to call the crisis hotline, 911 and or go to the nearest ED for appropriate evaluation and treatment of symptoms. To follow-up with primary care provider for other medical issues, concerns and or health care needs  #  Patient was discharged Home on 11/07/22  with a  plan to follow up as noted below.    Total Time spent with patient: 20 minutes  Past Psychiatric History:  Patient denies any past psychiatric hospitalizations or rehab. She reports she participated in any psychotherapy sessions was over 10 years ago. She denies any history of suicide.  Current Outpatient Provider: [facility] Past Medical History:  Past Medical History:  Diagnosis Date   Rectal cancer ([place])    rectal ca dx 11/2009   Rectal cancer [facility])     Past Surgical History:  Procedure Laterality Date   RECTAL SURGERY  03/2010   TO REMOVE CANCEROUS TUMOR IN RECTUM AND ESTAB TEMPORARY ILIEOSTOMY   REVERSE ILIEOSTOMY  10/2010   Family History:  Family History  Problem Relation Age of Onset   Graves' disease Mother    Hypertension Father    Heart disease Maternal Grandmother    Cancer Maternal Grandfather        BLADDER/PROSTATE   Breast cancer Paternal Grandmother    Family Psychiatric History:  Reports sister has diagnosis of Bipolar disorder.  Per chart review and confirmed by patient: "The patient has depression since her teens treated in [place] then by Dr. Candis Schatz now with me for maintenance care since 07/27/2016 being a colon cancer survivor working hard to support and keep up parents and sister. " Social History:  Social History   Substance and Sexual Activity  Alcohol Use Yes   Comment: Rarely     Social History   Substance and Sexual Activity  Drug Use No    Social History   Socioeconomic History   Marital status: Single    Spouse name: Not on file   Number of children: Not on file   Years of education: Not on file   Highest education level: Not on file  Occupational History   Not on file  Tobacco Use   Smoking status: Some Days    Packs/day: 0.50    Types: Cigarettes   Smokeless tobacco: Never  Vaping Use   Vaping Use: Never used  Substance and Sexual Activity   Alcohol use: Yes    Comment: Rarely   Drug use: No    Sexual activity: Not Currently    Partners: Male    Birth control/protection: None  Other Topics Concern   Not on file  Social History Narrative   Not on file   Social Determinants of Health   Financial Resource Strain: Not on file  Food Insecurity: Not on file  Transportation Needs: Not on file  Physical Activity: Not on file  Stress: Not on file  Social Connections: Not on file   SDOH:  SDOH Screenings   Tobacco Use: High Risk (09/28/2022)    Tobacco Cessation:  A prescription for an FDA-approved tobacco cessation medication provided at discharge  Current Medications:  Current Facility-Administered Medications  Medication Dose Route Frequency Provider Last Rate Last Admin   acetaminophen (TYLENOL) tablet 650 mg  650 mg Oral Q6H PRN Carrion-Carrero, Nesbit Michon, MD       alum & mag hydroxide-simeth (MAALOX/MYLANTA) 200-200-20 MG/5ML suspension 30 mL  30 mL Oral Q4H PRN Carrion-Carrero, Rochanda Harpham, MD       hydrOXYzine (ATARAX) tablet 25 mg  25 mg Oral TID PRN Carrion-Carrero, Caryl Ada, MD   25 mg at 11/06/22 1255   magnesium hydroxide (MILK OF MAGNESIA) suspension 30 mL  30 mL Oral Daily PRN Christene Slates, MD       nicotine (  NICODERM CQ - dosed in mg/24 hours) patch 21 mg  21 mg Transdermal Q0600 Carrion-Carrero, Caryl Ada, MD   21 mg at 11/07/22 0628   OLANZapine (ZYPREXA) tablet 5 mg  5 mg Oral QHS Carrion-Carrero, Trini Soldo, MD       traZODone (DESYREL) tablet 50 mg  50 mg Oral QHS PRN Carrion-Carrero, Gaynel Schaafsma, MD       Current Outpatient Medications  Medication Sig Dispense Refill   citalopram (CELEXA) 40 MG tablet TAKE 1 TABLET BY MOUTH EVERYDAY AT BEDTIME 90 tablet 1   [START ON 11/08/2022] nicotine (NICODERM CQ - DOSED IN MG/24 HOURS) 21 mg/24hr patch Place 1 patch (21 mg total) onto the skin daily at 6 (six) AM. 28 patch 0   OLANZapine (ZYPREXA) 5 MG tablet Take 1 tablet (5 mg total) by mouth at bedtime. 30 tablet 0   traZODone (DESYREL) 50 MG tablet Take 1 tablet (50  mg total) by mouth at bedtime as needed for sleep. 30 tablet 0    PTA Medications: (Not in a hospital admission)      12/20/2011   12:35 PM  Depression screen PHQ 2/9  Decreased Interest 0  Down, Depressed, Hopeless 0  PHQ - 2 Score 0    Flowsheet Row ED from 11/06/2022 in [place] No Risk       Musculoskeletal  Strength & Muscle Tone: within normal limits Gait & Station: normal Patient leans: N/A  Psychiatric Specialty Exam  Presentation  General Appearance:  Fairly Groomed; Appropriate for Environment  Eye Contact: Good  Speech: Clear and Coherent; Normal Rate  Speech Volume: Normal  Handedness: Right   Mood and Affect  Mood: Euthymic  Affect: Appropriate; Congruent   Thought Process  Thought Processes: Linear; Coherent  Descriptions of Associations:Intact  Orientation:Full (Time, Place and Person)  Thought Content:Logical  Diagnosis of Schizophrenia or Schizoaffective disorder in past: No    Hallucinations:Hallucinations: None  Ideas of Reference:None  Suicidal Thoughts:Suicidal Thoughts: No  Homicidal Thoughts:Homicidal Thoughts: No   Sensorium  Memory: Immediate Good; Recent Good; Remote Fair  Judgment: Intact  Insight: Fair   Materials engineer: Fair  Attention Span: Fair  Recall: [place] of Knowledge: Good  Language: Good   Psychomotor Activity  Psychomotor Activity: Psychomotor Activity: Restlessness   Assets  Assets: Desire for Improvement; Social Support; Housing   Sleep  Sleep: Sleep: Good Number of Hours of Sleep: 5   Nutritional Assessment (For OBS and FBC admissions only) Has the patient had a weight loss or gain of 10 pounds or more in the last 3 months?: No Has the patient had a decrease in food intake/or appetite?: No Does the patient have dental problems?: No Does the patient have eating habits or behaviors  that may be indicators of an eating disorder including binging or inducing vomiting?: No Has the patient recently lost weight without trying?: 0 Has the patient been eating poorly because of a decreased appetite?: 1 Malnutrition Screening Tool Score: 1    Physical Exam  Physical Exam Constitutional:      Appearance: Normal appearance.  Pulmonary:     Effort: Pulmonary effort is normal. No respiratory distress.  Musculoskeletal:        General: Normal range of motion.  Skin:    General: Skin is warm and dry.  Neurological:     General: No focal deficit present.     Mental Status: She is alert.    Review of Systems  Respiratory:  Negative for shortness of breath.   Cardiovascular:  Negative for chest pain.  Gastrointestinal:  Negative for abdominal pain.  Neurological:  Negative for headaches.  Psychiatric/Behavioral:  Negative for depression, hallucinations, memory loss, substance abuse and suicidal ideas. The patient is not nervous/anxious and does not have insomnia.    Blood pressure 98/62, pulse 86, temperature 98.1 F (36.7 C), temperature source Oral, resp. rate 18, SpO2 98 %. There is no height or weight on file to calculate BMI.  Demographic Factors:  Caucasian  Loss Factors: NA  Historical Factors: Impulsivity  Risk Reduction Factors:   Employed, Living with another person, especially a relative, and Positive social support  Continued Clinical Symptoms:  Previous Psychiatric Diagnoses and Treatments  Cognitive Features That Contribute To Risk:  None    Suicide Risk:  Mild:  Suicidal ideation of limited frequency, intensity, duration, and specificity.  There are no identifiable plans, no associated intent, mild dysphoria and related symptoms, good self-control (both objective and subjective assessment), few other risk factors, and identifiable protective factors, including available and accessible social support.  Plan Of Care/Follow-up recommendations:   Activity: as tolerated  Diet: heart healthy  Other: -Follow-up with your outpatient psychiatric provider -instructions on appointment date, time, and address (location) are provided to you in discharge paperwork.  -Take your psychiatric medications as prescribed at discharge - instructions are provided to you in the discharge paperwork  -Follow-up with outpatient primary care doctor and other specialists -for management of preventative medicine and chronic medical disease, including:   -Testing: Follow-up with outpatient provider for abnormal lab results:  Lipid Panel     Component Value Date/Time   CHOL 235 (H) 11/06/2022 1227   TRIG 86 11/06/2022 1227   HDL 84 11/06/2022 1227   CHOLHDL 2.8 11/06/2022 1227   VLDL 17 11/06/2022 1227   LDLCALC 134 (H) 11/06/2022 1227    -Recommend abstinence from alcohol, tobacco, and other illicit drug use at discharge.   -If your psychiatric symptoms recur, worsen, or if you have side effects to your psychiatric medications, call your outpatient psychiatric provider, 911, [phone] or go to the nearest emergency department.  -If suicidal thoughts recur, call your outpatient psychiatric provider, 911, [phone] or go to the nearest emergency department.  Disposition: Discharge Home  Christene Slates, MD 11/07/2022, 11:46 AM

## 2022-11-07 NOTE — ED Notes (Signed)
Patient was discharged to home today by provider. Patient was given an AVS with upcoming appointment and community resources. Patient was escorted and transported by her sister.

## 2022-11-09 LAB — HEMOGLOBIN A1C
Hgb A1c MFr Bld: 5.2 % (ref 4.8–5.6)
Mean Plasma Glucose: 103 mg/dL

## 2022-11-12 ENCOUNTER — Other Ambulatory Visit: Payer: Self-pay | Admitting: Behavioral Health

## 2022-11-12 DIAGNOSIS — F411 Generalized anxiety disorder: Secondary | ICD-10-CM

## 2022-11-29 ENCOUNTER — Encounter: Payer: Self-pay | Admitting: Behavioral Health

## 2022-11-29 ENCOUNTER — Ambulatory Visit (INDEPENDENT_AMBULATORY_CARE_PROVIDER_SITE_OTHER): Payer: 59 | Admitting: Behavioral Health

## 2022-11-29 DIAGNOSIS — F17200 Nicotine dependence, unspecified, uncomplicated: Secondary | ICD-10-CM | POA: Diagnosis not present

## 2022-11-29 DIAGNOSIS — F1520 Other stimulant dependence, uncomplicated: Secondary | ICD-10-CM | POA: Diagnosis not present

## 2022-11-29 DIAGNOSIS — F3341 Major depressive disorder, recurrent, in partial remission: Secondary | ICD-10-CM

## 2022-11-29 DIAGNOSIS — F411 Generalized anxiety disorder: Secondary | ICD-10-CM

## 2022-11-29 MED ORDER — TRAZODONE HCL 50 MG PO TABS: 50.0000 mg | ORAL_TABLET | Freq: Every evening | ORAL | 0 refills | Status: DC | PRN

## 2022-11-29 MED ORDER — LORAZEPAM 0.5 MG PO TABS: 0.5000 mg | ORAL_TABLET | ORAL | 0 refills | Status: DC | PRN

## 2022-11-29 MED ORDER — CITALOPRAM HYDROBROMIDE 40 MG PO TABS: ORAL_TABLET | ORAL | 1 refills | Status: DC

## 2022-11-29 NOTE — Progress Notes (Signed)
[facility]  Patient ID: NOE PITTSLEY,  MRN: [id number]  PCP: Maurice Small, MD  Date of Evaluation: 11/29/2022 Time spent:30 minutes  Chief Complaint:  Chief Complaint   Depression; Anxiety; Manic Behavior; Medication Problem; Medication Refill; Patient Education; Follow-up     HISTORY/CURRENT STATUS: HPI Denise Michael presents to the office today for follow-up and medication refills.  Says that she feels much better since her hospital visit.  She is more alert and focus this visit.  She says that she does not want to make any changes to her medications or consider a medication for a mood disorder right now. She says that she would like to continue to cut back on high use of caffeine and nicotine and see how it goes. Says family is watching her closely and if she experiences any extreme highs or lows she will notify this office.  She reports anxiety today at 2/10 and depression at 2/10. She said that she is sleeping well. She says that she has cut back on smoking and feels like she is making some progress.  She denies any mania, no psychosis, no SI/HI.    Individual Medical History/ Review of Systems: Changes? :No   Allergies: Patient has no known allergies.  Current Medications:  Current Outpatient Medications:    LORazepam (ATIVAN) 0.5 MG tablet, Take 1 tablet (0.5 mg total) by mouth as needed for anxiety., Disp: 60 tablet, Rfl: 0   citalopram (CELEXA) 40 MG tablet, TAKE 1 TABLET BY MOUTH EVERYDAY AT BEDTIME, Disp: 90 tablet, Rfl: 1   nicotine (NICODERM CQ - DOSED IN MG/24 HOURS) 21 mg/24hr patch, Place 1 patch (21 mg total) onto the skin daily at 6 (six) AM., Disp: 28 patch, Rfl: 0   OLANZapine (ZYPREXA) 5 MG tablet, Take 1 tablet (5 mg total) by mouth at bedtime., Disp: 30 tablet, Rfl: 0   traZODone (DESYREL) 50 MG tablet, Take 1 tablet (50 mg total) by mouth at bedtime as needed for sleep., Disp: 30 tablet, Rfl: 0 Medication Side Effects:  none  Family Medical/ Social History: Changes? No  MENTAL HEALTH EXAM:  There were no vitals taken for this visit.There is no height or weight on file to calculate BMI.  General Appearance: Casual, Neat, and Well Groomed  Eye Contact:  Good  Speech:  Clear and Coherent  Volume:  Normal  Mood:  Anxious  Affect:  Appropriate  Thought Process:  Coherent  Orientation:  Full (Time, Place, and Person)  Thought Content: Logical   Suicidal Thoughts:  No  Homicidal Thoughts:  No  Memory:  WNL  Judgement:  Good  Insight:  Good  Psychomotor Activity:  Normal  Concentration:  Concentration: Good  Recall:  Good  Fund of Knowledge: Good  Language: Good  Assets:  Desire for Improvement  ADL's:  Intact  Cognition: WNL  Prognosis:  Good    DIAGNOSES:    ICD-10-CM   1. Major depressive disorder, recurrent episode, in partial remission with anxious distress (HCC)  F33.41 citalopram (CELEXA) 40 MG tablet    traZODone (DESYREL) 50 MG tablet    2. Generalized anxiety disorder  F41.1 LORazepam (ATIVAN) 0.5 MG tablet    3. Smoking  F17.200     4. 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  We discussed her  recent hospitalization and evaluation on 12/23. We discussed her desire and readiness to quit smoking. Discussed her current stability and POC. I agreed to provide a small amount on benzodiazapine to assist with anxiety but will be switching from Klonopin to Ativan.  I reinforced that we would not increase and there would be no early refills.  We discussed her excessive caffeine intake drinking 6-7 cups per day and in the evening. Reinforced and educated her on the half life of Caffeine and recommended no more than 2-3 cups and none after 2 pm.   Pt is still trying to stop smoking. Continues to use nicotine patch. Wellbutrin has  minimally helped.  To continue Celexa  40 mg daily Stopped Wellbutrin to 300 mg XL daily Stopped Klonopin To start Ativan 0.5 mg once daily as needed for severe anxiety only. To report any changes or worsening symptoms To follow up in 4 weeks to reassess Provided emergency contact information Reviewed [place], NP

## 2022-12-27 ENCOUNTER — Ambulatory Visit: Payer: 59 | Admitting: Behavioral Health

## 2022-12-28 ENCOUNTER — Other Ambulatory Visit: Payer: Self-pay | Admitting: Behavioral Health

## 2022-12-28 DIAGNOSIS — F17200 Nicotine dependence, unspecified, uncomplicated: Secondary | ICD-10-CM

## 2022-12-28 DIAGNOSIS — F3341 Major depressive disorder, recurrent, in partial remission: Secondary | ICD-10-CM

## 2023-01-06 ENCOUNTER — Other Ambulatory Visit: Payer: Self-pay | Admitting: Behavioral Health

## 2023-01-06 DIAGNOSIS — F3341 Major depressive disorder, recurrent, in partial remission: Secondary | ICD-10-CM

## 2023-01-09 NOTE — Telephone Encounter (Signed)
Please call to schedule an appt,  Canceled/no showed last visit.

## 2023-01-14 NOTE — Telephone Encounter (Signed)
Attempted to call and line busy 2x

## 2023-02-18 ENCOUNTER — Encounter: Payer: Self-pay | Admitting: Behavioral Health

## 2023-02-18 ENCOUNTER — Ambulatory Visit: Payer: 59 | Admitting: Behavioral Health

## 2023-02-18 DIAGNOSIS — F3341 Major depressive disorder, recurrent, in partial remission: Secondary | ICD-10-CM

## 2023-02-18 DIAGNOSIS — F411 Generalized anxiety disorder: Secondary | ICD-10-CM

## 2023-02-18 MED ORDER — LORAZEPAM 0.5 MG PO TABS: 0.5000 mg | ORAL_TABLET | ORAL | 2 refills | Status: DC | PRN

## 2023-02-18 MED ORDER — TRAZODONE HCL 50 MG PO TABS: 50.0000 mg | ORAL_TABLET | Freq: Every evening | ORAL | 3 refills | Status: DC | PRN

## 2023-02-18 MED ORDER — CITALOPRAM HYDROBROMIDE 40 MG PO TABS: ORAL_TABLET | ORAL | 1 refills | Status: DC

## 2023-02-18 NOTE — Progress Notes (Signed)
[facility]  Patient ID: Denise Michael,  MRN: [id number]  PCP: Shirlean Mylar, MD  Date of Evaluation: 02/18/2023 Time spent:30 minutes  Chief Complaint:  Chief Complaint   Anxiety; Depression; Follow-up; Patient Education; Medication Refill     HISTORY/CURRENT STATUS: HPI  Denise Michael presents to the office today for follow-up and medication refills.  She is doing much better since cutting back on nicotine and caffeine. Requesting no medication changes this visit. Says family is watching her closely and if she experiences any extreme highs or lows she will notify this office.  She reports anxiety today at 2/10 and depression at 2/10. She said that she is sleeping well. She says that she has cut back on smoking and feels like she is making some progress.  She denies any mania, no psychosis, no SI/HI.        Individual Medical History/ Review of Systems: Changes? :No   Allergies: Patient has no known allergies.  Current Medications:  Current Outpatient Medications:    citalopram (CELEXA) 40 MG tablet, TAKE 1 TABLET BY MOUTH EVERYDAY AT BEDTIME, Disp: 90 tablet, Rfl: 1   LORazepam (ATIVAN) 0.5 MG tablet, Take 1 tablet (0.5 mg total) by mouth as needed for anxiety., Disp: 60 tablet, Rfl: 2   nicotine (NICODERM CQ - DOSED IN MG/24 HOURS) 21 mg/24hr patch, Place 1 patch (21 mg total) onto the skin daily at 6 (six) AM., Disp: 28 patch, Rfl: 0   OLANZapine (ZYPREXA) 5 MG tablet, Take 1 tablet (5 mg total) by mouth at bedtime., Disp: 30 tablet, Rfl: 0   traZODone (DESYREL) 50 MG tablet, Take 1 tablet (50 mg total) by mouth at bedtime as needed for sleep., Disp: 30 tablet, Rfl: 3 Medication Side Effects: none  Family Medical/ Social History: Changes? No  MENTAL HEALTH EXAM:  There were no vitals taken for this visit.There is no height or weight on file to calculate BMI.  General Appearance: Casual, Neat, and Well Groomed  Eye Contact:  Good  Speech:   Clear and Coherent  Volume:  Normal  Mood:  Anxious  Affect:  Appropriate  Thought Process:  Coherent  Orientation:  Full (Time, Place, and Person)  Thought Content: Logical   Suicidal Thoughts:  No  Homicidal Thoughts:  No  Memory:  WNL  Judgement:  Good  Insight:  Good  Psychomotor Activity:  Normal  Concentration:  Concentration: Good  Recall:  Good  Fund of Knowledge: Good  Language: Good  Assets:  Desire for Improvement  ADL's:  Intact  Cognition: WNL  Prognosis:  Good    DIAGNOSES:    ICD-10-CM   1. Generalized anxiety disorder  F41.1 LORazepam (ATIVAN) 0.5 MG tablet    2. Major depressive disorder, recurrent episode, in partial remission with anxious distress  F33.41 traZODone (DESYREL) 50 MG tablet    citalopram (CELEXA) 40 MG tablet      Receiving Psychotherapy: No    RECOMMENDATIONS:   Greater than 50% of 30 min face to face time with patient was spent on counseling and coordination of care.  Still doing very well since her hospitalization back in December. She has reduced her caffeine and smoking. She was much more calm this visit with less psychomotor agitation.  To continue Celexa  40 mg daily To continue Trazodone 50 mg for sleep To start Ativan 0.5 mg once daily as needed for severe anxiety only. To report any changes or worsening symptoms To follow up in 4 weeks to  reassess Provided emergency contact information Reviewed PDMP                  Joan FloresBrian A Rielle Schlauch, NP

## 2023-04-29 ENCOUNTER — Other Ambulatory Visit: Payer: Self-pay | Admitting: Behavioral Health

## 2023-04-29 DIAGNOSIS — F3341 Major depressive disorder, recurrent, in partial remission: Secondary | ICD-10-CM

## 2023-04-29 DIAGNOSIS — F17200 Nicotine dependence, unspecified, uncomplicated: Secondary | ICD-10-CM

## 2023-05-20 ENCOUNTER — Ambulatory Visit: Payer: 59 | Admitting: Behavioral Health

## 2023-05-20 ENCOUNTER — Encounter: Payer: Self-pay | Admitting: Behavioral Health

## 2023-05-20 DIAGNOSIS — F17200 Nicotine dependence, unspecified, uncomplicated: Secondary | ICD-10-CM

## 2023-05-20 DIAGNOSIS — F3341 Major depressive disorder, recurrent, in partial remission: Secondary | ICD-10-CM | POA: Diagnosis not present

## 2023-05-20 DIAGNOSIS — F411 Generalized anxiety disorder: Secondary | ICD-10-CM | POA: Diagnosis not present

## 2023-05-20 DIAGNOSIS — IMO0001 Reserved for inherently not codable concepts without codable children: Secondary | ICD-10-CM

## 2023-05-20 MED ORDER — LORAZEPAM 0.5 MG PO TABS
0.5000 mg | ORAL_TABLET | ORAL | 2 refills | Status: DC | PRN
Start: 2023-05-20 — End: 2023-08-23

## 2023-05-20 MED ORDER — CITALOPRAM HYDROBROMIDE 40 MG PO TABS
ORAL_TABLET | ORAL | 3 refills | Status: DC
Start: 2023-05-20 — End: 2023-08-23

## 2023-05-20 NOTE — Progress Notes (Signed)
[facility]  Patient ID: Denise Michael,  MRN: [id number]  PCP: Shirlean Mylar, MD  Date of Evaluation: 05/20/2023 Time spent:20 minutes  Chief Complaint:   HISTORY/CURRENT STATUS: HPI  Individual Medical History/ Review of Systems: Changes? :No   Allergies: Patient has no known allergies.  Current Medications:  Current Outpatient Medications:    citalopram (CELEXA) 40 MG tablet, TAKE 1 TABLET BY MOUTH EVERYDAY AT BEDTIME, Disp: 90 tablet, Rfl: 1   LORazepam (ATIVAN) 0.5 MG tablet, Take 1 tablet (0.5 mg total) by mouth as needed for anxiety., Disp: 60 tablet, Rfl: 2   nicotine (NICODERM CQ - DOSED IN MG/24 HOURS) 21 mg/24hr patch, Place 1 patch (21 mg total) onto the skin daily at 6 (six) AM., Disp: 28 patch, Rfl: 0   OLANZapine (ZYPREXA) 5 MG tablet, Take 1 tablet (5 mg total) by mouth at bedtime., Disp: 30 tablet, Rfl: 0   traZODone (DESYREL) 50 MG tablet, Take 1 tablet (50 mg total) by mouth at bedtime as needed for sleep., Disp: 30 tablet, Rfl: 3 Medication Side Effects: none  Family Medical/ Social History: Changes? No  MENTAL HEALTH EXAM:  There were no vitals taken for this visit.There is no height or weight on file to calculate BMI.  General Appearance: Casual, Neat, and Well Groomed  Eye Contact:  Good  Speech:  Clear and Coherent  Volume:  Normal  Mood:  Depressed  Affect:  Appropriate  Thought Process:  Coherent  Orientation:  Full (Time, Place, and Person)  Thought Content: Logical   Suicidal Thoughts:  No  Homicidal Thoughts:  No  Memory:  WNL  Judgement:  Good  Insight:  Good  Psychomotor Activity:  Normal  Concentration:  Concentration: Good  Recall:  Good  Fund of Knowledge: Good  Language: Good  Assets:  Desire for Improvement  ADL's:  Intact  Cognition: WNL  Prognosis:  Good    DIAGNOSES: No diagnosis found.  Receiving Psychotherapy: No    RECOMMENDATIONS:   Greater than 50% of 20 min face to face time with patient was  spent on counseling and coordination of care.  She has reduced her caffeine and smoking. She was much more calm this visit with less psychomotor agitation.  To continue Celexa  40 mg daily To continue Trazodone 50 mg for sleep To start Ativan 0.5 mg once daily as needed for severe anxiety only. To report any changes or worsening symptoms To follow up in 12 weeks to reassess Provided emergency contact information Reviewed PDMP   Joan Flores, NP

## 2023-08-04 ENCOUNTER — Other Ambulatory Visit: Payer: Self-pay | Admitting: Student

## 2023-08-23 ENCOUNTER — Encounter: Payer: Self-pay | Admitting: Behavioral Health

## 2023-08-23 ENCOUNTER — Ambulatory Visit: Payer: 59 | Admitting: Behavioral Health

## 2023-08-23 DIAGNOSIS — F411 Generalized anxiety disorder: Secondary | ICD-10-CM | POA: Diagnosis not present

## 2023-08-23 DIAGNOSIS — F3341 Major depressive disorder, recurrent, in partial remission: Secondary | ICD-10-CM

## 2023-08-23 DIAGNOSIS — F17200 Nicotine dependence, unspecified, uncomplicated: Secondary | ICD-10-CM

## 2023-08-23 MED ORDER — BUPROPION HCL ER (XL) 150 MG PO TB24
150.0000 mg | ORAL_TABLET | Freq: Every day | ORAL | 1 refills | Status: DC
Start: 2023-08-23 — End: 2023-10-18

## 2023-08-23 MED ORDER — LORAZEPAM 0.5 MG PO TABS
0.5000 mg | ORAL_TABLET | ORAL | 2 refills | Status: AC | PRN
Start: 2023-08-23 — End: ?

## 2023-08-23 MED ORDER — CITALOPRAM HYDROBROMIDE 40 MG PO TABS
ORAL_TABLET | ORAL | 3 refills | Status: DC
Start: 2023-08-23 — End: 2024-05-30

## 2023-08-23 NOTE — Progress Notes (Signed)
[facility]  Patient ID: Denise Michael,  MRN: [id number]  PCP: Shirlean Mylar, MD  Date of Evaluation: 08/23/2023 Time spent:30 minutes  Chief Complaint:  Chief Complaint   Anxiety; Depression; Follow-up; Medication Refill; Patient Education; Stress     HISTORY/CURRENT STATUS: HPI  Denise Michael presents to the office today for follow-up and medication refills.  She is doing much better since cutting back on nicotine and caffeine. She doe not exhibit the psychomotor agitation as she has previous visits. She is feeling a little more depressed and thinks it may be due to seasonal changes.  Says family is watching her closely and if she experiences any extreme highs or lows she will notify this office.  She reports anxiety today at 2/10 and depression at 2/10. She said that she is sleeping well. She says that she has cut back on smoking and feels like she is making some progress.  She denies any mania, no psychosis, no SI/HI.       Individual Medical History/ Review of Systems: Changes? :No   Allergies: Patient has no known allergies.  Current Medications:  Current Outpatient Medications:    buPROPion (WELLBUTRIN XL) 150 MG 24 hr tablet, Take 1 tablet (150 mg total) by mouth daily., Disp: 30 tablet, Rfl: 1   citalopram (CELEXA) 40 MG tablet, TAKE 1 TABLET BY MOUTH EVERYDAY AT BEDTIME, Disp: 90 tablet, Rfl: 3   LORazepam (ATIVAN) 0.5 MG tablet, Take 1 tablet (0.5 mg total) by mouth as needed for anxiety., Disp: 60 tablet, Rfl: 2   nicotine (NICODERM CQ - DOSED IN MG/24 HOURS) 21 mg/24hr patch, Place 1 patch (21 mg total) onto the skin daily at 6 (six) AM., Disp: 28 patch, Rfl: 0   OLANZapine (ZYPREXA) 5 MG tablet, Take 1 tablet (5 mg total) by mouth at bedtime., Disp: 30 tablet, Rfl: 0   traZODone (DESYREL) 50 MG tablet, Take 1 tablet (50 mg total) by mouth at bedtime as needed for sleep., Disp: 30 tablet, Rfl: 3 Medication Side Effects: none  Family  Medical/ Social History: Changes? No  MENTAL HEALTH EXAM:  There were no vitals taken for this visit.There is no height or weight on file to calculate BMI.  General Appearance: Casual  Eye Contact:  Good  Speech:  Clear and Coherent  Volume:  Normal  Mood:  Depressed  Affect:  Appropriate, Depressed, and Flat  Thought Process:  Coherent  Orientation:  Full (Time, Place, and Person)  Thought Content: Logical   Suicidal Thoughts:  No  Homicidal Thoughts:  No  Memory:  WNL  Judgement:  Good  Insight:  Good  Psychomotor Activity:  NA  Concentration:  Concentration: Good  Recall:  Good  Fund of Knowledge: Good  Language: Good  Assets:  Desire for Improvement  ADL's:  Intact  Cognition: WNL  Prognosis:  Good    DIAGNOSES:    ICD-10-CM   1. Generalized anxiety disorder  F41.1 buPROPion (WELLBUTRIN XL) 150 MG 24 hr tablet    LORazepam (ATIVAN) 0.5 MG tablet    2. 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 Smoking  F17.200       Receiving Psychotherapy: No    RECOMMENDATIONS:   Greater than 50% of 30 min face to face time with patient was spent on counseling and coordination of care.  She  has reduced her caffeine and smoking. She was much more calm this visit with less psychomotor agitation.  To continue Celexa  40 mg daily To start Wellbutrin 150 mg XL daily To continue Trazodone 50 mg for sleep To start Ativan 0.5 mg once daily as needed for severe anxiety only. To report any changes or worsening symptoms To follow up in 8 weeks to reassess Provided emergency contact information Reviewed PDMP    Joan Flores, NP

## 2023-09-15 ENCOUNTER — Other Ambulatory Visit: Payer: Self-pay | Admitting: Behavioral Health

## 2023-09-15 DIAGNOSIS — F3341 Major depressive disorder, recurrent, in partial remission: Secondary | ICD-10-CM

## 2023-09-15 DIAGNOSIS — F411 Generalized anxiety disorder: Secondary | ICD-10-CM

## 2023-10-18 ENCOUNTER — Other Ambulatory Visit: Payer: Self-pay | Admitting: Behavioral Health

## 2023-10-18 DIAGNOSIS — F411 Generalized anxiety disorder: Secondary | ICD-10-CM

## 2023-10-18 DIAGNOSIS — F3341 Major depressive disorder, recurrent, in partial remission: Secondary | ICD-10-CM

## 2023-10-24 ENCOUNTER — Ambulatory Visit (INDEPENDENT_AMBULATORY_CARE_PROVIDER_SITE_OTHER): Payer: 59 | Admitting: Behavioral Health

## 2023-10-24 DIAGNOSIS — Z0389 Encounter for observation for other suspected diseases and conditions ruled out: Secondary | ICD-10-CM

## 2023-10-24 NOTE — Progress Notes (Signed)
Pt did not show for scheduled appt and did not provide 24 hour notice as required.

## 2024-04-11 ENCOUNTER — Other Ambulatory Visit: Payer: Self-pay | Admitting: Behavioral Health

## 2024-04-11 DIAGNOSIS — F411 Generalized anxiety disorder: Secondary | ICD-10-CM

## 2024-04-11 DIAGNOSIS — F3341 Major depressive disorder, recurrent, in partial remission: Secondary | ICD-10-CM

## 2024-05-16 ENCOUNTER — Other Ambulatory Visit: Payer: Self-pay | Admitting: Behavioral Health

## 2024-05-16 DIAGNOSIS — F411 Generalized anxiety disorder: Secondary | ICD-10-CM

## 2024-05-16 DIAGNOSIS — F3341 Major depressive disorder, recurrent, in partial remission: Secondary | ICD-10-CM

## 2024-05-19 ENCOUNTER — Other Ambulatory Visit: Payer: Self-pay | Admitting: Behavioral Health

## 2024-05-19 DIAGNOSIS — F411 Generalized anxiety disorder: Secondary | ICD-10-CM

## 2024-05-19 DIAGNOSIS — F3341 Major depressive disorder, recurrent, in partial remission: Secondary | ICD-10-CM

## 2024-05-30 ENCOUNTER — Encounter: Payer: Self-pay | Admitting: Behavioral Health

## 2024-05-30 ENCOUNTER — Ambulatory Visit: Admitting: Behavioral Health

## 2024-05-30 DIAGNOSIS — F3341 Major depressive disorder, recurrent, in partial remission: Secondary | ICD-10-CM | POA: Diagnosis not present

## 2024-05-30 DIAGNOSIS — F411 Generalized anxiety disorder: Secondary | ICD-10-CM | POA: Diagnosis not present

## 2024-05-30 MED ORDER — BUPROPION HCL ER (XL) 150 MG PO TB24
150.0000 mg | ORAL_TABLET | Freq: Every day | ORAL | 1 refills | Status: AC
Start: 2024-05-30 — End: ?

## 2024-05-30 MED ORDER — CITALOPRAM HYDROBROMIDE 40 MG PO TABS
ORAL_TABLET | ORAL | 3 refills | Status: AC
Start: 2024-05-30 — End: ?

## 2024-05-30 NOTE — Progress Notes (Signed)
 [facility]  Patient ID: Denise Michael,  MRN: [id number]  PCP: Denise Ivanoff, MD  Date of Evaluation: 05/30/2024 Time spent:20 minutes  Chief Complaint:  Chief Complaint   Anxiety; Depression; Follow-up; Medication Refill; Patient Education; Stress     HISTORY/CURRENT STATUS: HPI Denise Michael presents to the office today for follow-up and medication refills.  Says that she has been doing well overall since last visit. Says she is experiencing some family stressors. Sister has severe gambling problem and had to file bankruptcy. Says family is watching her closely and if she experiences any extreme highs or lows she will notify this office.  She reports anxiety today at 2/10 and depression at 2/10. She said that she is sleeping well. She says that she has cut back on smoking and feels like she is making some progress.  She denies any mania, no psychosis, no SI/HI.    Individual Medical History/ Review of Systems: Changes? :No   Allergies: Patient has no known allergies.  Current Medications:  Current Outpatient Medications:    buPROPion  (WELLBUTRIN  XL) 150 MG 24 hr tablet, Take 1 tablet (150 mg total) by mouth daily., Disp: 90 tablet, Rfl: 1   citalopram  (CELEXA ) 40 MG tablet, TAKE 1 TABLET BY MOUTH EVERYDAY AT BEDTIME, Disp: 90 tablet, Rfl: 3   LORazepam  (ATIVAN ) 0.5 MG tablet, Take 1 tablet (0.5 mg total) by mouth as needed for anxiety., Disp: 60 tablet, Rfl: 2   nicotine  (NICODERM CQ  - DOSED IN MG/24 HOURS) 21 mg/24hr patch, Place 1 patch (21 mg total) onto the skin daily at 6 (six) AM., Disp: 28 patch, Rfl: 0 Medication Side Effects: none  Family Medical/ Social History: Changes? No  MENTAL HEALTH EXAM:  There were no vitals taken for this visit.There is no height or weight on file to calculate BMI.  General Appearance: Casual, Neat, and Well Groomed  Eye Contact:  Good  Speech:  Clear and Coherent  Volume:  Normal  Mood:  NA  Affect:   Appropriate  Thought Process:  Coherent  Orientation:  Full (Time, Place, and Person)  Thought Content: Logical   Suicidal Thoughts:  No  Homicidal Thoughts:  No  Memory:  WNL  Judgement:  Good  Insight:  Good  Psychomotor Activity:  Normal  Concentration:  Concentration: Good  Recall:  Good  Fund of Knowledge: Good  Language: Good  Assets:  Desire for Improvement  ADL's:  Intact  Cognition: WNL  Prognosis:  Good    DIAGNOSES:    ICD-10-CM   1. Generalized anxiety disorder  F41.1 buPROPion  (WELLBUTRIN  XL) 150 MG 24 hr tablet    2. Major depressive disorder, recurrent episode, in partial remission with anxious distress (HCC)  F33.41 buPROPion  (WELLBUTRIN  XL) 150 MG 24 hr tablet    citalopram  (CELEXA ) 40 MG tablet      Receiving Psychotherapy: No    RECOMMENDATIONS:   Greater than 50% of 20 min face to face time with patient was spent on counseling and coordination of care.  She has reduced her caffeine and smoking. She was much more calm this visit with less psychomotor agitation.  To continue Celexa   40 mg daily Continue Wellbutrin  150 mg XL daily Continue Trazodone  50 mg for sleep To report any changes or worsening symptoms To follow up in 6 months to reassess Provided emergency contact information Reviewed PDMP    Redell DELENA Pizza, NP

## 2024-11-30 ENCOUNTER — Ambulatory Visit: Admitting: Behavioral Health

## 2024-12-11 ENCOUNTER — Ambulatory Visit: Admitting: Behavioral Health

## 2024-12-11 NOTE — Progress Notes (Signed)
 Patient did not show for scheduled appointment and did not provide 24-hour notice and required.  However she is normally present for appointments and due to the weather concerns there will be no additional charges assessed today.

## 2024-12-21 ENCOUNTER — Other Ambulatory Visit: Payer: Self-pay | Admitting: Behavioral Health

## 2024-12-21 DIAGNOSIS — F3341 Major depressive disorder, recurrent, in partial remission: Secondary | ICD-10-CM

## 2024-12-21 DIAGNOSIS — F411 Generalized anxiety disorder: Secondary | ICD-10-CM
